# Patient Record
Sex: Female | Born: 1985 | Race: White | Hispanic: No | Marital: Single | Smoking: Current every day smoker
Health system: Southern US, Community
[De-identification: ages and names within clinical notes are randomized; demographics above are authoritative.]

## PROBLEM LIST (undated history)

## (undated) DIAGNOSIS — R87619 Unspecified abnormal cytological findings in specimens from cervix uteri: Secondary | ICD-10-CM

## (undated) DIAGNOSIS — N390 Urinary tract infection, site not specified: Secondary | ICD-10-CM

## (undated) DIAGNOSIS — R87629 Unspecified abnormal cytological findings in specimens from vagina: Secondary | ICD-10-CM

## (undated) DIAGNOSIS — D649 Anemia, unspecified: Secondary | ICD-10-CM

## (undated) DIAGNOSIS — N159 Renal tubulo-interstitial disease, unspecified: Secondary | ICD-10-CM

## (undated) DIAGNOSIS — F1910 Other psychoactive substance abuse, uncomplicated: Secondary | ICD-10-CM

## (undated) DIAGNOSIS — F329 Major depressive disorder, single episode, unspecified: Secondary | ICD-10-CM

## (undated) DIAGNOSIS — F32A Depression, unspecified: Secondary | ICD-10-CM

## (undated) DIAGNOSIS — F419 Anxiety disorder, unspecified: Secondary | ICD-10-CM

## (undated) DIAGNOSIS — IMO0002 Reserved for concepts with insufficient information to code with codable children: Secondary | ICD-10-CM

## (undated) DIAGNOSIS — A599 Trichomoniasis, unspecified: Secondary | ICD-10-CM

## (undated) HISTORY — PX: INDUCED ABORTION: SHX677

---
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
## 2006-02-09 ENCOUNTER — Emergency Department (HOSPITAL_COMMUNITY): Admission: EM | Admit: 2006-02-09 | Discharge: 2006-02-09 | Payer: Self-pay | Admitting: Emergency Medicine

## 2006-07-04 ENCOUNTER — Ambulatory Visit: Payer: Self-pay | Admitting: Gastroenterology

## 2006-07-11 ENCOUNTER — Ambulatory Visit: Payer: Self-pay | Admitting: Gastroenterology

## 2006-12-11 ENCOUNTER — Inpatient Hospital Stay (HOSPITAL_COMMUNITY): Admission: AD | Admit: 2006-12-11 | Discharge: 2006-12-11 | Payer: Self-pay | Admitting: Obstetrics

## 2007-05-02 ENCOUNTER — Inpatient Hospital Stay (HOSPITAL_COMMUNITY): Admission: AD | Admit: 2007-05-02 | Discharge: 2007-05-02 | Payer: Self-pay | Admitting: Obstetrics & Gynecology

## 2007-07-08 ENCOUNTER — Inpatient Hospital Stay (HOSPITAL_COMMUNITY): Admission: AD | Admit: 2007-07-08 | Discharge: 2007-07-08 | Payer: Self-pay | Admitting: Obstetrics and Gynecology

## 2007-09-18 ENCOUNTER — Inpatient Hospital Stay (HOSPITAL_COMMUNITY): Admission: AD | Admit: 2007-09-18 | Discharge: 2007-09-20 | Payer: Self-pay | Admitting: Obstetrics and Gynecology

## 2007-10-15 ENCOUNTER — Inpatient Hospital Stay (HOSPITAL_COMMUNITY): Admission: AD | Admit: 2007-10-15 | Discharge: 2007-10-18 | Payer: Self-pay | Admitting: Obstetrics & Gynecology

## 2007-10-25 ENCOUNTER — Inpatient Hospital Stay (HOSPITAL_COMMUNITY): Admission: AD | Admit: 2007-10-25 | Discharge: 2007-10-25 | Payer: Self-pay | Admitting: Obstetrics & Gynecology

## 2007-10-31 ENCOUNTER — Inpatient Hospital Stay (HOSPITAL_COMMUNITY): Admission: AD | Admit: 2007-10-31 | Discharge: 2007-11-02 | Payer: Self-pay | Admitting: Obstetrics & Gynecology

## 2009-02-07 ENCOUNTER — Emergency Department (HOSPITAL_COMMUNITY): Admission: EM | Admit: 2009-02-07 | Discharge: 2009-02-07 | Payer: Self-pay | Admitting: Emergency Medicine

## 2009-05-12 ENCOUNTER — Inpatient Hospital Stay (HOSPITAL_COMMUNITY): Admission: AD | Admit: 2009-05-12 | Discharge: 2009-05-13 | Payer: Self-pay | Admitting: Obstetrics & Gynecology

## 2009-09-24 ENCOUNTER — Inpatient Hospital Stay (HOSPITAL_COMMUNITY): Admission: AD | Admit: 2009-09-24 | Discharge: 2009-09-24 | Payer: Self-pay | Admitting: Obstetrics and Gynecology

## 2009-10-27 ENCOUNTER — Inpatient Hospital Stay (HOSPITAL_COMMUNITY): Admission: AD | Admit: 2009-10-27 | Discharge: 2009-10-27 | Payer: Self-pay | Admitting: Obstetrics & Gynecology

## 2009-11-02 ENCOUNTER — Emergency Department (HOSPITAL_COMMUNITY): Admission: EM | Admit: 2009-11-02 | Discharge: 2009-11-02 | Payer: Self-pay | Admitting: Emergency Medicine

## 2009-11-06 ENCOUNTER — Emergency Department (HOSPITAL_COMMUNITY): Admission: EM | Admit: 2009-11-06 | Discharge: 2009-11-06 | Payer: Self-pay | Admitting: Emergency Medicine

## 2010-01-01 ENCOUNTER — Inpatient Hospital Stay (HOSPITAL_COMMUNITY): Admission: AD | Admit: 2010-01-01 | Discharge: 2010-01-01 | Payer: Self-pay | Admitting: Obstetrics & Gynecology

## 2010-01-10 IMAGING — CR DG LUMBAR SPINE COMPLETE 4+V
5 series · 5 of 5 positions shown · non-contrast
Comparison: None

CLINICAL DATA: Low back pain.  Recent fall.

LUMBAR SPINE - COMPLETE 4+ VIEW

[t l-spine a.p.]
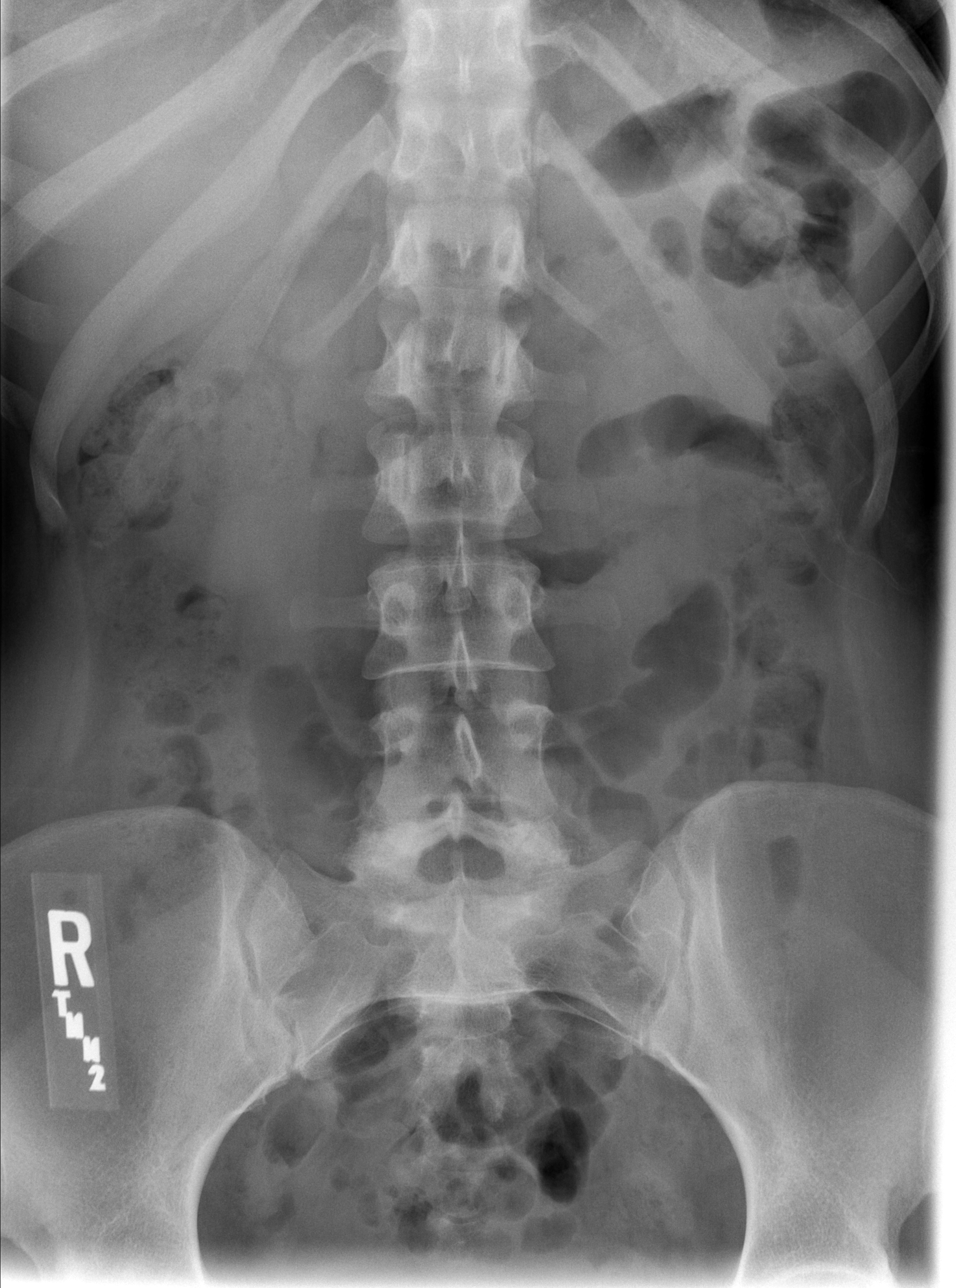

[t l-spine oblique exposure (1 of 2)]
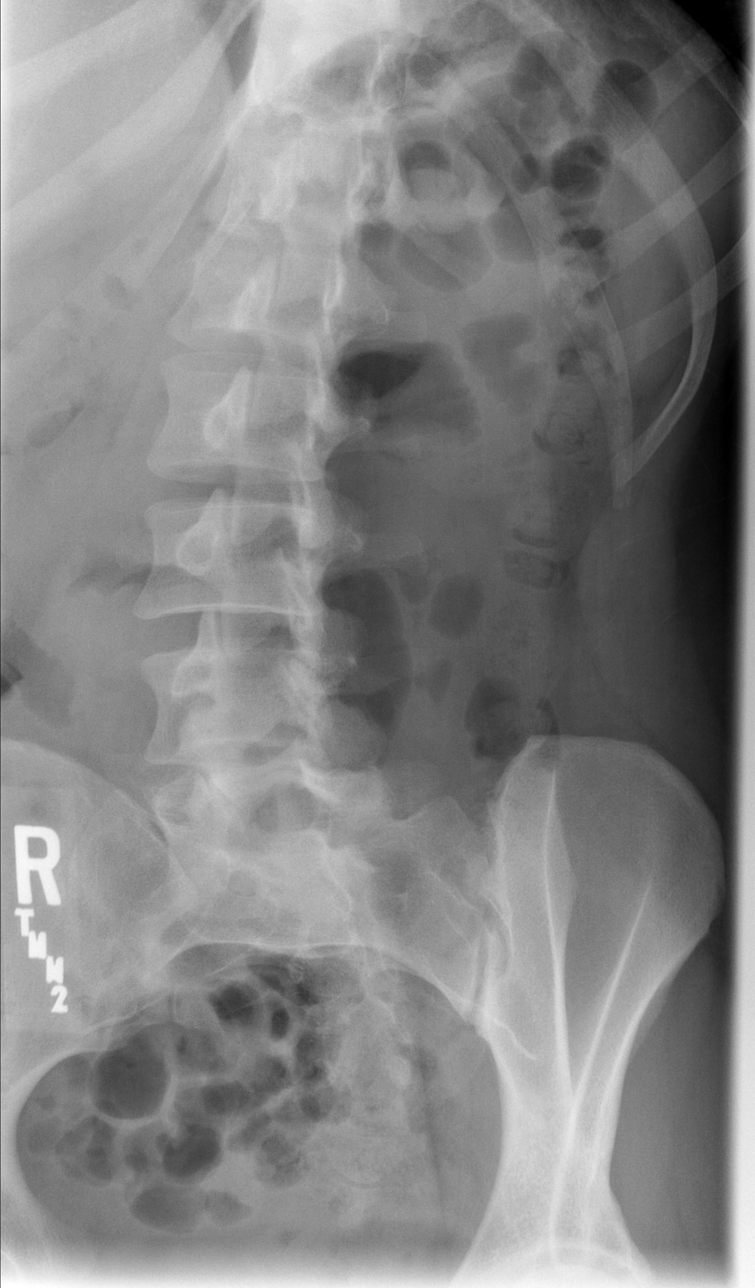

[t l-spine oblique exposure (2 of 2)]
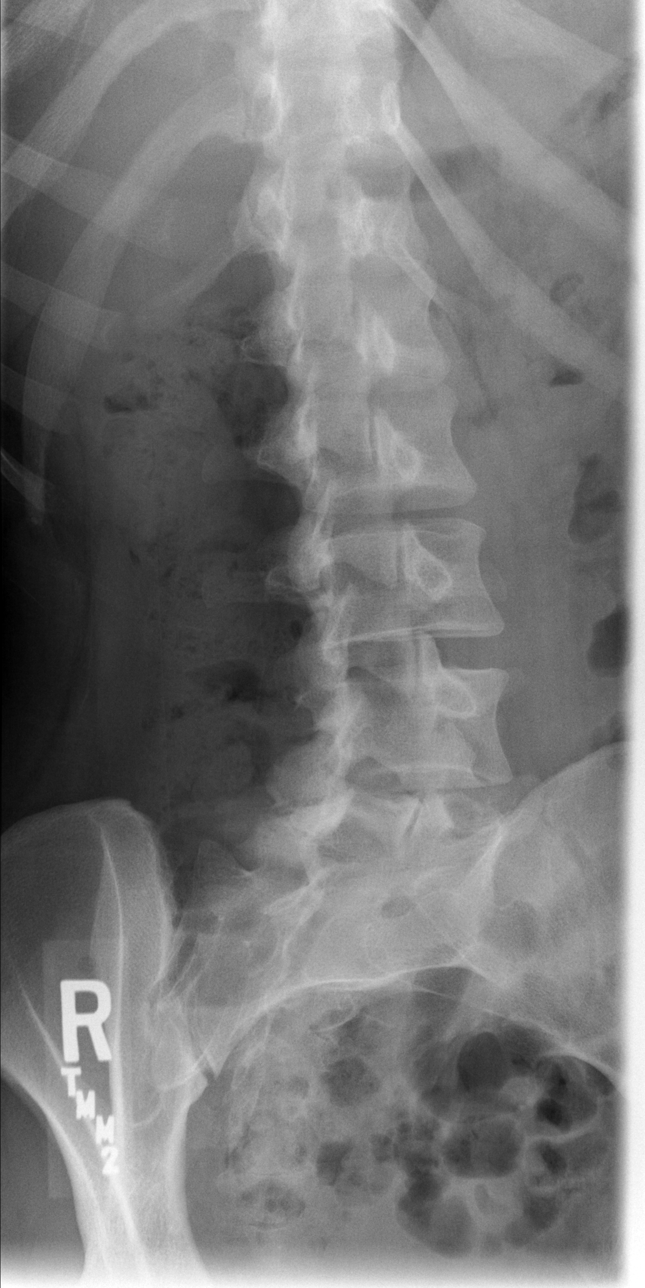

[t l-spine lat]
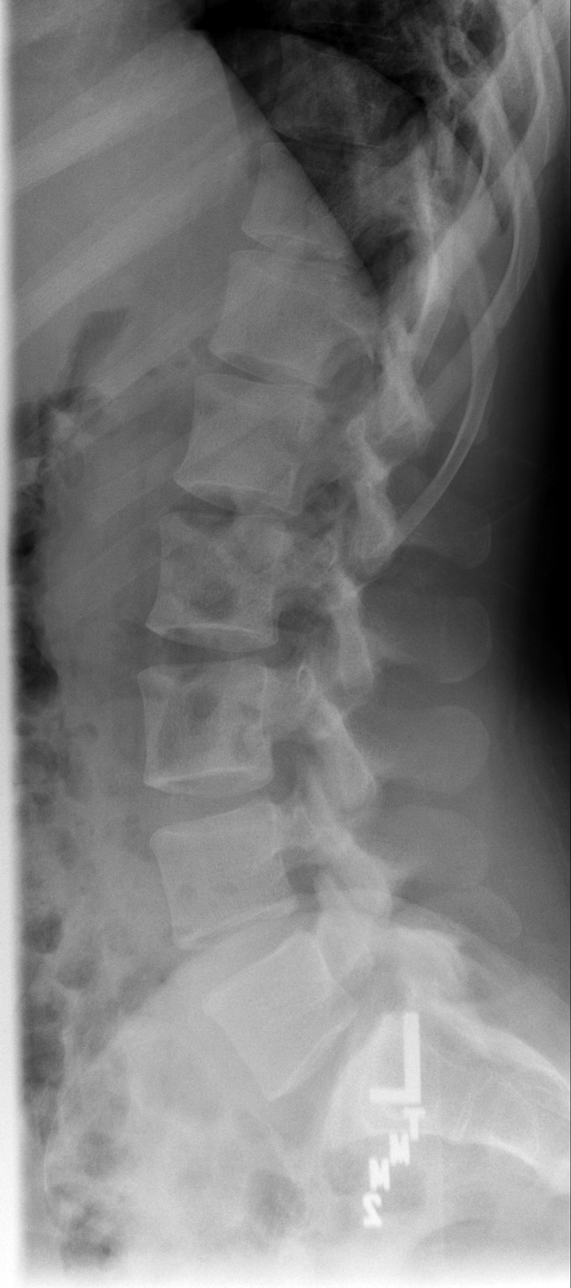

[t l-spine l5-s1 spot]
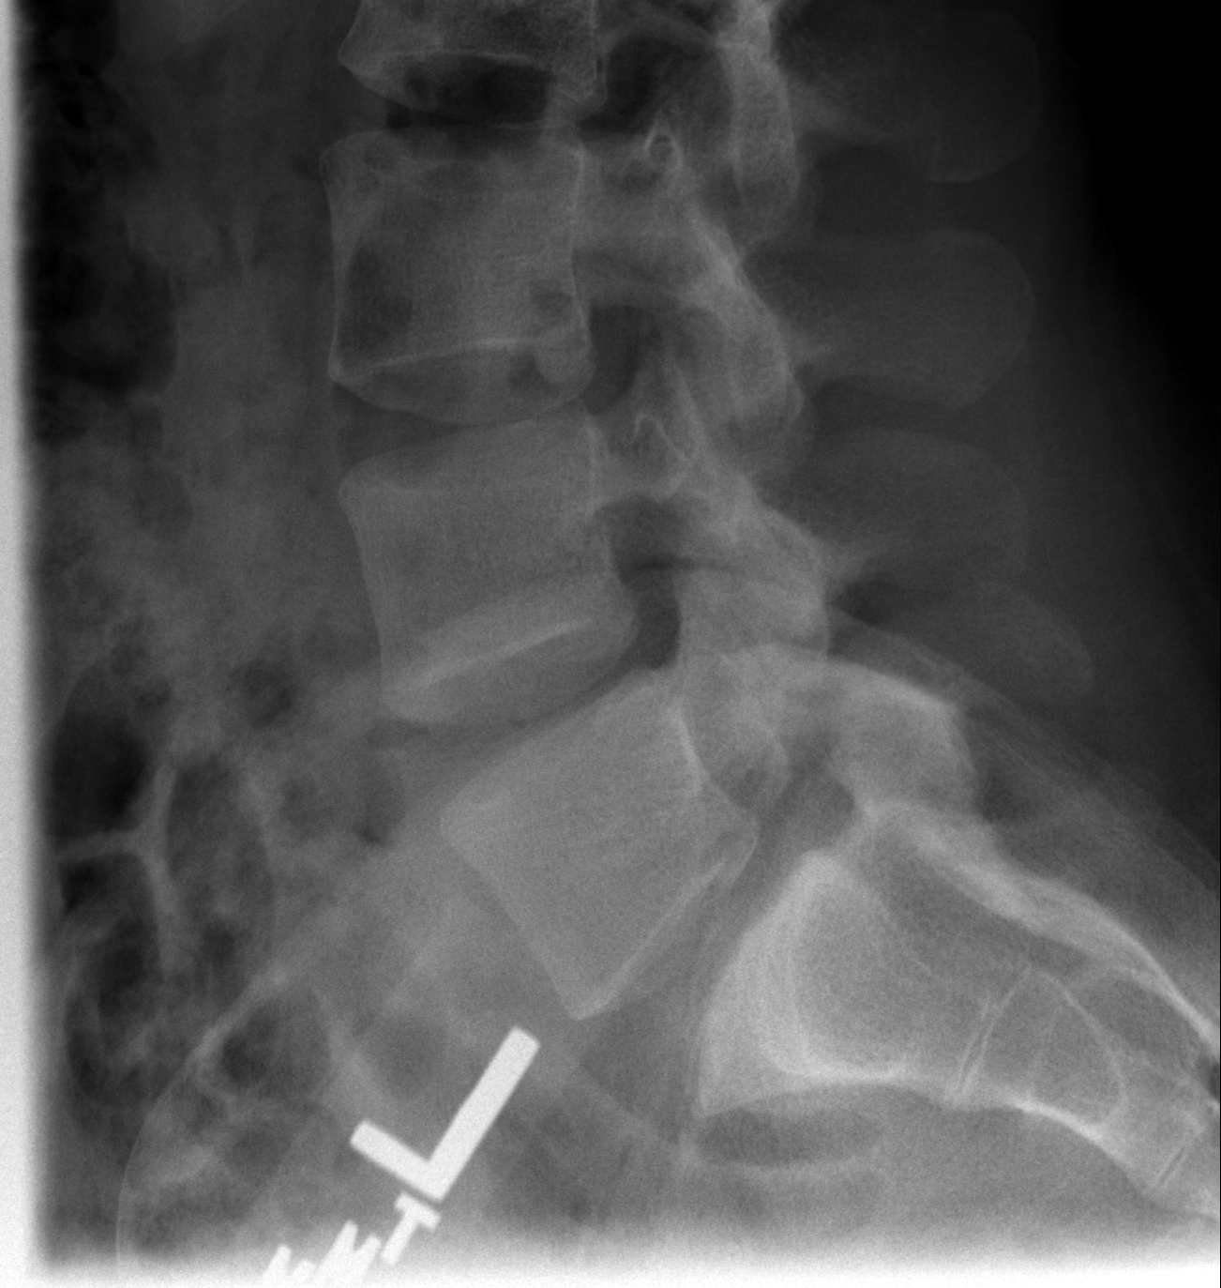

[5 of 5 positions shown; findings below may reference images not displayed]

FINDINGS: There is no evidence of lumbar spine fracture.  Alignment
is normal.  Intervertebral disc spaces are maintained.
IMPRESSION: Negative.

## 2010-11-06 ENCOUNTER — Emergency Department (HOSPITAL_COMMUNITY)
Admission: EM | Admit: 2010-11-06 | Discharge: 2010-11-06 | Payer: Self-pay | Source: Home / Self Care | Admitting: Emergency Medicine

## 2011-02-15 LAB — URINALYSIS, ROUTINE W REFLEX MICROSCOPIC
Glucose, UA: NEGATIVE mg/dL
Hgb urine dipstick: NEGATIVE
Ketones, ur: 15 mg/dL — AB
Nitrite: NEGATIVE
Protein, ur: NEGATIVE mg/dL
Specific Gravity, Urine: 1.03 (ref 1.005–1.030)
Urobilinogen, UA: 0.2 mg/dL (ref 0.0–1.0)
pH: 5.5 (ref 5.0–8.0)

## 2011-02-15 LAB — POCT I-STAT, CHEM 8
BUN: 14 mg/dL (ref 6–23)
Creatinine, Ser: 0.9 mg/dL (ref 0.4–1.2)
Glucose, Bld: 97 mg/dL (ref 70–99)
Sodium: 141 mEq/L (ref 135–145)
TCO2: 27 mmol/L (ref 0–100)

## 2011-02-15 LAB — WET PREP, GENITAL
Clue Cells Wet Prep HPF POC: NONE SEEN
Trich, Wet Prep: NONE SEEN
Yeast Wet Prep HPF POC: NONE SEEN

## 2011-02-15 LAB — URINE MICROSCOPIC-ADD ON

## 2011-02-15 LAB — GC/CHLAMYDIA PROBE AMP, GENITAL: GC Probe Amp, Genital: NEGATIVE

## 2011-02-21 LAB — URINALYSIS, ROUTINE W REFLEX MICROSCOPIC
Glucose, UA: NEGATIVE mg/dL
Ketones, ur: NEGATIVE mg/dL
Leukocytes, UA: NEGATIVE
pH: 7 (ref 5.0–8.0)

## 2011-02-21 LAB — WET PREP, GENITAL
Trich, Wet Prep: NONE SEEN
Yeast Wet Prep HPF POC: NONE SEEN

## 2011-02-21 LAB — URINE MICROSCOPIC-ADD ON

## 2011-03-09 LAB — COMPREHENSIVE METABOLIC PANEL
BUN: 7 mg/dL (ref 6–23)
CO2: 28 mEq/L (ref 19–32)
Calcium: 9.3 mg/dL (ref 8.4–10.5)
Creatinine, Ser: 0.71 mg/dL (ref 0.4–1.2)
GFR calc non Af Amer: >60 mL/min (ref >60)
Glucose, Bld: 102 mg/dL — ABNORMAL HIGH (ref 70–99)

## 2011-03-09 LAB — DIFFERENTIAL
Eosinophils Absolute: 0.1 10*3/uL (ref 0.0–0.7)
Lymphocytes Relative: 21 % (ref 12–46)
Lymphs Abs: 2.3 10*3/uL (ref 0.7–4.0)
Neutro Abs: 7.6 10*3/uL (ref 1.7–7.7)
Neutrophils Relative %: 71 % (ref 43–77)

## 2011-03-09 LAB — CBC
Hemoglobin: 12.3 g/dL (ref 12.0–15.0)
MCHC: 34.7 g/dL (ref 30.0–36.0)
MCV: 97.2 fL (ref 78.0–100.0)
RBC: 3.65 MIL/uL — ABNORMAL LOW (ref 3.87–5.11)

## 2011-03-10 LAB — URINALYSIS, ROUTINE W REFLEX MICROSCOPIC
Glucose, UA: NEGATIVE mg/dL
Protein, ur: NEGATIVE mg/dL

## 2011-03-10 LAB — POCT PREGNANCY, URINE: Preg Test, Ur: NEGATIVE

## 2011-03-11 LAB — CBC
MCHC: 34.2 g/dL (ref 30.0–36.0)
RBC: 4.02 MIL/uL (ref 3.87–5.11)
RDW: 12.5 % (ref 11.5–15.5)

## 2011-03-11 LAB — URINALYSIS, ROUTINE W REFLEX MICROSCOPIC
Glucose, UA: NEGATIVE mg/dL
Nitrite: NEGATIVE
Protein, ur: NEGATIVE mg/dL
Urobilinogen, UA: 0.2 mg/dL (ref 0.0–1.0)

## 2011-03-11 LAB — POCT PREGNANCY, URINE: Preg Test, Ur: NEGATIVE

## 2011-03-15 LAB — URINALYSIS, ROUTINE W REFLEX MICROSCOPIC
Leukocytes, UA: NEGATIVE
Nitrite: NEGATIVE
Specific Gravity, Urine: 1.025 (ref 1.005–1.030)
pH: 5.5 (ref 5.0–8.0)

## 2011-03-15 LAB — WET PREP, GENITAL
Trich, Wet Prep: NONE SEEN
Yeast Wet Prep HPF POC: NONE SEEN

## 2011-03-15 LAB — URINE MICROSCOPIC-ADD ON

## 2011-03-15 LAB — POCT PREGNANCY, URINE: Preg Test, Ur: NEGATIVE

## 2011-03-18 LAB — URINE CULTURE: Colony Count: >=100000

## 2011-03-18 LAB — URINE MICROSCOPIC-ADD ON

## 2011-03-18 LAB — PREGNANCY, URINE: Preg Test, Ur: NEGATIVE

## 2011-03-18 LAB — URINALYSIS, ROUTINE W REFLEX MICROSCOPIC
Bilirubin Urine: NEGATIVE
Ketones, ur: NEGATIVE mg/dL
Nitrite: POSITIVE — AB
Protein, ur: NEGATIVE mg/dL
Urobilinogen, UA: 0.2 mg/dL (ref 0.0–1.0)

## 2011-04-06 ENCOUNTER — Inpatient Hospital Stay (HOSPITAL_COMMUNITY): Payer: Self-pay

## 2011-04-06 ENCOUNTER — Encounter (HOSPITAL_COMMUNITY): Payer: Self-pay | Admitting: Radiology

## 2011-04-06 ENCOUNTER — Inpatient Hospital Stay (HOSPITAL_COMMUNITY)
Admission: AD | Admit: 2011-04-06 | Discharge: 2011-04-06 | Disposition: A | Discharge status: Home / Self Care | Payer: Self-pay | Source: Ambulatory Visit | Attending: Obstetrics & Gynecology | Admitting: Obstetrics & Gynecology

## 2011-04-06 DIAGNOSIS — O2390 Unspecified genitourinary tract infection in pregnancy, unspecified trimester: Secondary | ICD-10-CM | POA: Insufficient documentation

## 2011-04-06 DIAGNOSIS — A499 Bacterial infection, unspecified: Secondary | ICD-10-CM | POA: Insufficient documentation

## 2011-04-06 DIAGNOSIS — N760 Acute vaginitis: Secondary | ICD-10-CM | POA: Insufficient documentation

## 2011-04-06 DIAGNOSIS — R109 Unspecified abdominal pain: Secondary | ICD-10-CM | POA: Insufficient documentation

## 2011-04-06 DIAGNOSIS — B9689 Other specified bacterial agents as the cause of diseases classified elsewhere: Secondary | ICD-10-CM | POA: Insufficient documentation

## 2011-04-06 LAB — URINALYSIS, ROUTINE W REFLEX MICROSCOPIC
Bilirubin Urine: NEGATIVE
Glucose, UA: NEGATIVE mg/dL
Hgb urine dipstick: NEGATIVE
Protein, ur: NEGATIVE mg/dL

## 2011-04-06 LAB — CBC
HCT: 35.3 % — ABNORMAL LOW (ref 36.0–46.0)
MCH: 31.4 pg (ref 26.0–34.0)
MCV: 95.7 fL (ref 78.0–100.0)
RDW: 12.4 % (ref 11.5–15.5)
WBC: 9.7 10*3/uL (ref 4.0–10.5)

## 2011-04-06 LAB — WET PREP, GENITAL

## 2011-04-07 LAB — GC/CHLAMYDIA PROBE AMP, GENITAL
Chlamydia, DNA Probe: NEGATIVE
GC Probe Amp, Genital: NEGATIVE

## 2011-04-20 NOTE — Discharge Summary (Signed)
NAMEKYLIEANN, [name] [name]               ACCOUNT NO.:  [id number]   MEDICAL RECORD NO.:  [id number]          PATIENT TYPE:  INP   LOCATION:  9318                          FACILITY:  [facility]   PHYSICIAN:  Ilda Mori, M.D.   DATE OF BIRTH:  1985-12-24   DATE OF ADMISSION:  09/18/2007  DATE OF DISCHARGE:  09/20/2007                               DISCHARGE SUMMARY   FINAL DIAGNOSIS:  Right pyelonephritis.   SECONDARY DIAGNOSIS:  A 28-week intrauterine pregnancy.   PROCEDURE:  IV antibiotics.   CONDITION ON DISCHARGE:  Improved.   This is a 25 year old gravida 2, para 1 who was admitted through MAU  with 3 days of fever and chills, which was diagnosed in MAU as being  consistent with right pyelonephritis.  She was treated with Rocephin 1  gram IV q.24h. with good response.  She became afebrile and remained so  for 24 hours.  On the morning of the third hospital day, she was felt  ready to be discharged.  She was discharged on a regular diet, told her  to limit her activity.  She was given Keflex 500 mg to take four times a  day for 5 days and asked to make a follow-up appointment in the office  in 1 week.   LABORATORY DATA:  Since she was 28 weeks, she had a 1-hour glucose test  done which was 105 mg/dl.  Her hemoglobin on admission was 10.3 with a  19,000 white count.  Her urine grew out 100,000 E-coli.  Sensitivities  are pending at the time of this dictation.      Ilda Mori, M.D.  Electronically Signed     RK/MEDQ  D:  09/20/2007  T:  09/21/2007  Job:  [id number]

## 2011-04-20 NOTE — Discharge Summary (Signed)
[name], [name]               ACCOUNT NO.:  [id number]   MEDICAL RECORD NO.:  [id number]          PATIENT TYPE:  INP   LOCATION:  9153                          [facility]ACILITY:  [facility]   PHYSICIAN:  Ilda Mori, M.D.   DATE OF BIRTH:  1986-03-05   DATE OF ADMISSION:  10/15/2007  DATE OF DISCHARGE:  10/18/2007                               DISCHARGE SUMMARY   FINAL DIAGNOSIS:  Pyelonephritis.   SECONDARY DIAGNOSIS:  Intrauterine pregnancy 32 weeks' gestation.   PROCEDURES:  Tocolytics and IV antibiotics.   CONDITION ON DISCHARGE:  Was stable.   This is a 25 year old, gravida 2, para 0-1-0-1 with her first delivery  at 59 weeks who presented at 80 weeks' gestation with fever, chills and  back pain.  The patient had a similar episode one month ago for which  she was hospitalized at [facility] and found to have  pyelonephritis with greater than 100,000 E-coli sensitive to all  antibiotics tested.  Evaluation on admission revealed some mild right  CVA tenderness.  There were no other signs of localized infection.  The  rest of her exam was normal.  Her urinalysis showed negative nitrites  and only small leukocytes.  White blood count on admission was 22.5  thousand with a left shift.  Renal profile was normal with a creatinine  of 0.49.  Her cervix on admission was 3- to 4-cm dilated, 80% effaced  with a vertex at -1 station.  Her admitting diagnosis was probable  recurrent right pyelonephritis and premature cervical dilatation.  The  patient was started on ampicillin and admitted.  On the morning of the  first hospital day, she was given IV steroids because of her advanced  cervical dilatation and started on oral tocolytic nifedipine 10 mg q.6h.   The hospital course was benign with resolution of her fevers.  Her  antibiotic was changed from ampicillin to Rocephin on the second  hospital day.  The cultures from her admission were only 9000 colonies  were counted, and her blood  cultures were negative, and therefore  pyelonephritis could not be confirmed, although no other source for her  fever and chills was identified.  On the morning of the fourth  postoperative hospital day, the patient had been afebrile for 48 hours.  She had received IV antibiotics for 48 hours.  She had received  dexamethasone protocol for fetal lung maturity and had received oral  tocolytics.  She was perfectly stable at that time and voiced no  complaints.  In addition, a fetal fibronectin had been done on the  morning of discharge, and this came back negative.  She was therefore  felt ready to be discharged.  She was discharged on a regular diet.  She  was asked to limit her activity and to rest at home.  She was given  Procardia 10 mg to take 3 times a day, Macrobid to take 1 tablet daily  for prophylaxis, and she was asked to return to the office in 5 days.      Ilda Mori, M.D.  Electronically Signed  RK/MEDQ  D:  10/18/2007  T:  10/19/2007  Job:  [id number]

## 2011-04-23 NOTE — Procedures (Signed)
[facility]                            ABDOMINAL ULTRASOUND REPORT   NAME:Kersh, CHIANN GOFFREDO                      MRN:          [id number]  DATE:07/12/2006                            DOB:          1986-06-13    ACCESSION # [id number]   READING PHYSICIAN:  Vania Rea. Jarold Motto, MD, [facility], FACP   PROCEDURE:  Multiplanar abdominal ultrasound imaging was performed in the  upright, supine, right and left lateral decubitus positions.   RESULTS:  Abdominal aorta normal.  Maximal diameter 1.2 cm.  The IVC is  patent.   The pancreas appears normal throughout the head, body and tail without  evidence of ductal dilatation, pancreatic masses, or peripancreatic  inflammation.   Gallbladder is well distended, thin walled, with no pericholecystic fluid or  intraluminal echogenic foci to suggest gallstone disease.  Wall thickness  2.9 mm.   The common bile duct measures 2.7 mm in maximal diameter without evidence of  intraluminal foci.   The liver appears normal without evidence of parenchymal lesion, ductal  dilatation or vascular abnormality.   Kidneys are normal in appearance, right 9.8 cm, left 9.5 cm.   Spleen is normal in size, measuring 10.9 cm without parenchymal lesion.   ASSESSMENT:  This is a normal upper abdominal ultrasound exam without  evidence of cholelithiasis or biliary ductal dilatation.  The pancreas and  liver are well visualized and appear normal.                                   Vania Rea. Jarold Motto, MD, [facility], [place]   DRP/MedQ  DD:  07/12/2006  DT:  07/12/2006  Job #:  [id number]   cc:   Barbette Hair. Arlyce Dice, MD, [facility]

## 2011-04-23 NOTE — Discharge Summary (Signed)
[name] [name], [name] [name]               ACCOUNT NO.:  [id number]   MEDICAL RECORD NO.:  [id number]          PATIENT TYPE:  INP   LOCATION:  9320                          [facility]ACILITY:  [facility]   PHYSICIAN:  Roseanna Rainbow, M.D.DATE OF BIRTH:  Jul 30, 1986   DATE OF ADMISSION:  10/31/2005  DATE OF DISCHARGE:  11/07/2005                                 DISCHARGE SUMMARY   CHIEF COMPLAINT:  The patient is an 25 year old, gravida 1, Caucasian female  with estimated date of confinement of December 17, 2005 who complains of  leaking fluid.   HISTORY OF PRESENT ILLNESS:  The patient reports leakage of fluid for  several hours prior to presentation.  She described it has gush of clear  fluid. She denied any uterine contraction.  She also denied any fever or  chills or dysuria.   PAST SURGICAL HISTORY:  She denies.   PAST MEDICAL HISTORY:  She denies.   MEDICATIONS:  Prenatal vitamins.   ALLERGIES:  No known drug allergies.   SOCIAL HISTORY:  She reports tobacco use.  She denies any alcohol or  recreational drug use.   PHYSICAL EXAMINATION:  VITAL SIGNS:  Stable. Afebrile.  ABDOMEN:  Gravid, nontender.  PELVIC:  Deferred.  Vaginal pool.  Nitrazine and fern positive.   LABORATORY DATA:  Ultrasound showed cephalic presentation.  Amniotic fluid  index 10.4.  The cervix non-measurable length, 1.2 cm dilated.  Fetal heart  tracing reassuring.  Tocodynamometer:  No uterine contractions.   ASSESSMENT:  Intrauterine pregnancy at 33+ weeks with preterm premature  rupture of membranes.   PLAN:  Admission.  Expectant management.  Broad spectrum parenteral  antibiotics.   HOSPITAL COURSE:  The patient was admitted and received a course of steroids  and was started on parenteral clindamycin.  On ultrasound on November 28,  the estimated fetal weight gross percentile was between the 5th and the 10th  percentile with an amniotic fluid index of 12.4.  A sample of the vaginal  pool was sent to the lab  for fetal lung maturity study.  The vaginal pool  was positive for PG. At this point the plan was made for induction of labor.  The patient had spontaneous labor and was delivered of live female on  December 1.  Her postpartum course was uneventful and she was discharged to  home on postpartum day #2.   DISCHARGE DIAGNOSES:  1.  Intrauterine pregnancy at 33+ weeks with preterm premature rupture of      membranes.  2.  Preterm labor.  3.  Preterm delivery.   OPERATION/PROCEDURE:  Vaginal delivery.   CONDITION ON DISCHARGE:  Good.   DIET:  Regular.   MEDICATIONS:  Ibuprofen, prenatal vitamins.   DISPOSITION:  The patient was to follow up in the office in six weeks.      Roseanna Rainbow, M.D.  Electronically Signed     LAJ/MEDQ  D:  11/26/2005  T:  11/28/2005  Job:  [id number]

## 2011-04-23 NOTE — Assessment & Plan Note (Signed)
[facility]                           GASTROENTEROLOGY OFFICE NOTE   NAME:HOYLEBernette, Seeman                      MRN:          [id number]  DATE:07/04/2006                            DOB:          06/23/1986    REFERRING PHYSICIAN:  Self-referred.   PROBLEM:  Abdominal pain.   HISTORY:  Molli is a pleasant, generally healthy 25 year old white female  who had her first child approximately 8 months ago.  She admits to having a  lot of stress since the birth of her child.  She says that she has been  having some abdominal pain over the past several months which at times is  sharp and located in the epigastrium and right upper quadrant.  She says at  other times, the pain seems to migrate to different places in her abdomen.  She has had some associated nausea but no vomiting, had one particularly bad  episode a couple of months ago that lasted for about 20 to 30 minutes and  then resolved.  She has not had any associated fever or chills and though  her appetite has been some decreased, her weight has been stable.  She says  she had been having some occasional constipation after childbirth and some  problems with external hemorrhoids but over the past couple of months, her  bowels have been more regular.  She denies any heartburn or indigestion and  says that greasy foods and fatty foods do seem to aggravate her pain.   She denies any aspirin or NSAID use, is not on any regular medications,  denies any ETOH or drug use.   CURRENT MEDICATIONS:  None.   ALLERGIES:  NO KNOWN DRUG ALLERGIES.   PAST MEDICAL HISTORY:  Benign.   FAMILY HISTORY:  Pertinent for gallbladder disease in her mother and her  sister and diabetes in a great-grandfather and colon cancer in her great-  grandfather.   SOCIAL HISTORY:  The patient is single.  Lives with her boyfriend and some  other friends along with her child.  She is not currently working.  She has  a 10th  grade education.  She is a smoker.  Again, denies alcohol or drug  use.   REVIEW OF SYSTEMS:  Pertinent for some low back pain and depression.  She is  currently having a menstrual period.   PHYSICAL EXAMINATION:  GENERAL APPEARANCE:  A well-developed white female in  no acute distress.  VITAL SIGNS:  Height is 5 feet 6 inches, weight is 172, blood pressure  112/74, pulse 88.  HEENT:  Atraumatic and normocephalic.  EOMI, PERRLA.  Sclerae are anicteric.  LUNGS:  Clear to auscultation and percussion.  CARDIOVASCULAR:  Regular rate and rhythm with S1 and S2.  ABDOMEN:  Soft.  Bowel sounds are active.  She is nontender.  There is no  mass or hepatosplenomegaly.  RECTAL:  Exam is not done at this time.   IMPRESSION:  A 25 year old white female with intermittent epigastric and  then generalized abdominal discomfort associated with nausea, rule out  gallbladder disease, rule out irritable bowel syndrome, rule out  inflammatory bowel disease though doubt.   PLAN:  1.  Check CBC with differential, CMET and sed rate today.  2.  Schedule upper abdominal ultrasound.  3.  Trial of Robinul Forte 2 mg p.o. b.i.d. and Protonix 40 mg q.a.m.  She      was given samples and a prescription.  4.  Return office visit in three to four weeks or sooner p.r.n.                                   Amy Esterwood, PA-C                                Robert D. Arlyce Dice, MD, [facility]   AE/MedQ  DD:  07/04/2006  DT:  07/05/2006  Job #:  [id number]

## 2011-06-07 ENCOUNTER — Emergency Department (HOSPITAL_COMMUNITY)
Admission: EM | Admit: 2011-06-07 | Discharge: 2011-06-07 | Disposition: A | Discharge status: Home / Self Care | Payer: Self-pay | Attending: Emergency Medicine | Admitting: Emergency Medicine

## 2011-06-07 DIAGNOSIS — R059 Cough, unspecified: Secondary | ICD-10-CM | POA: Insufficient documentation

## 2011-06-07 DIAGNOSIS — R0989 Other specified symptoms and signs involving the circulatory and respiratory systems: Secondary | ICD-10-CM | POA: Insufficient documentation

## 2011-06-07 DIAGNOSIS — R05 Cough: Secondary | ICD-10-CM | POA: Insufficient documentation

## 2011-06-07 DIAGNOSIS — R0609 Other forms of dyspnea: Secondary | ICD-10-CM | POA: Insufficient documentation

## 2011-06-07 DIAGNOSIS — J069 Acute upper respiratory infection, unspecified: Secondary | ICD-10-CM | POA: Insufficient documentation

## 2011-06-07 DIAGNOSIS — R509 Fever, unspecified: Secondary | ICD-10-CM | POA: Insufficient documentation

## 2011-06-07 DIAGNOSIS — J3489 Other specified disorders of nose and nasal sinuses: Secondary | ICD-10-CM | POA: Insufficient documentation

## 2011-06-07 DIAGNOSIS — J40 Bronchitis, not specified as acute or chronic: Secondary | ICD-10-CM | POA: Insufficient documentation

## 2011-07-06 ENCOUNTER — Emergency Department (HOSPITAL_COMMUNITY)
Admission: EM | Admit: 2011-07-06 | Discharge: 2011-07-06 | Disposition: A | Discharge status: Home / Self Care | Payer: Self-pay | Attending: Emergency Medicine | Admitting: Emergency Medicine

## 2011-07-06 ENCOUNTER — Encounter (HOSPITAL_COMMUNITY): Payer: Self-pay | Admitting: *Deleted

## 2011-07-06 ENCOUNTER — Emergency Department (HOSPITAL_COMMUNITY): Payer: Self-pay

## 2011-07-06 DIAGNOSIS — IMO0001 Reserved for inherently not codable concepts without codable children: Secondary | ICD-10-CM | POA: Insufficient documentation

## 2011-07-06 DIAGNOSIS — R630 Anorexia: Secondary | ICD-10-CM | POA: Insufficient documentation

## 2011-07-06 DIAGNOSIS — R509 Fever, unspecified: Secondary | ICD-10-CM | POA: Insufficient documentation

## 2011-07-06 DIAGNOSIS — R109 Unspecified abdominal pain: Secondary | ICD-10-CM | POA: Insufficient documentation

## 2011-07-06 DIAGNOSIS — R0602 Shortness of breath: Secondary | ICD-10-CM | POA: Insufficient documentation

## 2011-07-06 DIAGNOSIS — B9789 Other viral agents as the cause of diseases classified elsewhere: Secondary | ICD-10-CM | POA: Insufficient documentation

## 2011-07-06 LAB — URINALYSIS, ROUTINE W REFLEX MICROSCOPIC
Bilirubin Urine: NEGATIVE
Hgb urine dipstick: NEGATIVE
Protein, ur: NEGATIVE mg/dL
Urobilinogen, UA: 0.2 mg/dL (ref 0.0–1.0)

## 2011-07-06 LAB — URINE MICROSCOPIC-ADD ON

## 2011-09-14 LAB — CBC
HCT: 30.1 — ABNORMAL LOW
HCT: 24.5 — ABNORMAL LOW
HCT: 27.1 — ABNORMAL LOW
Hemoglobin: 9.4 — ABNORMAL LOW
Hemoglobin: 9.7 — ABNORMAL LOW
MCHC: 34.8
MCHC: 35.5
MCHC: 35.1
MCHC: 35.5
MCV: 94.3
MCV: 94.5
Platelets: 284
Platelets: 307
Platelets: 174
RBC: 3.06 — ABNORMAL LOW
RBC: 2.6 — ABNORMAL LOW
RBC: 2.84 — ABNORMAL LOW
RBC: 2.9 — ABNORMAL LOW
RDW: 13.6
RDW: 13.7
RDW: 13.3
WBC: 20.1 — ABNORMAL HIGH
WBC: 23 — ABNORMAL HIGH

## 2011-09-14 LAB — URINE MICROSCOPIC-ADD ON

## 2011-09-14 LAB — DIFFERENTIAL
Basophils Absolute: 0
Basophils Absolute: 0
Eosinophils Relative: 0
Lymphocytes Relative: 5 — ABNORMAL LOW
Lymphocytes Relative: 5 — ABNORMAL LOW
Lymphs Abs: 1.1
Lymphs Abs: 1.2
Monocytes Absolute: 1.8 — ABNORMAL HIGH
Monocytes Relative: 8
Neutro Abs: 20.9 — ABNORMAL HIGH

## 2011-09-14 LAB — URINALYSIS, ROUTINE W REFLEX MICROSCOPIC
Glucose, UA: NEGATIVE
Glucose, UA: NEGATIVE
Hgb urine dipstick: NEGATIVE
Leukocytes, UA: NEGATIVE
Nitrite: NEGATIVE
Nitrite: NEGATIVE
Protein, ur: NEGATIVE
Specific Gravity, Urine: 1.02
Specific Gravity, Urine: 1.025
Urobilinogen, UA: 0.2
Urobilinogen, UA: 0.2
pH: 6

## 2011-09-14 LAB — CULTURE, BLOOD (ROUTINE X 2)
Culture: NO GROWTH
Culture: NO GROWTH

## 2011-09-14 LAB — COMPREHENSIVE METABOLIC PANEL
AST: 15
BUN: 2 — ABNORMAL LOW
CO2: 22
Calcium: 8.4
Chloride: 107
Creatinine, Ser: 0.48
GFR calc Af Amer: >60
GFR calc non Af Amer: >60
Total Bilirubin: 0.3

## 2011-09-14 LAB — STREP B DNA PROBE

## 2011-09-14 LAB — URINE CULTURE: Culture: NO GROWTH

## 2011-09-14 LAB — RENAL FUNCTION PANEL
BUN: 4 — ABNORMAL LOW
CO2: 24
Chloride: 103
Creatinine, Ser: 0.49
Glucose, Bld: 116 — ABNORMAL HIGH
Potassium: 4.2

## 2011-09-14 LAB — FETAL FIBRONECTIN
Fetal Fibronectin: POSITIVE
Fetal Fibronectin: NEGATIVE

## 2011-09-16 LAB — DIFFERENTIAL
Basophils Relative: 0
Eosinophils Absolute: 0
Monocytes Absolute: 2.4 — ABNORMAL HIGH
Neutro Abs: 15.7 — ABNORMAL HIGH
Neutrophils Relative %: 80 — ABNORMAL HIGH

## 2011-09-16 LAB — CBC
Hemoglobin: 10.3 — ABNORMAL LOW
RBC: 3.11 — ABNORMAL LOW
RDW: 12.7
WBC: 19.7 — ABNORMAL HIGH

## 2011-09-16 LAB — URINALYSIS, ROUTINE W REFLEX MICROSCOPIC
Nitrite: POSITIVE — AB
Specific Gravity, Urine: 1.02
Urobilinogen, UA: 2 — ABNORMAL HIGH
pH: 7

## 2011-09-16 LAB — URINE MICROSCOPIC-ADD ON

## 2011-09-16 LAB — COMPREHENSIVE METABOLIC PANEL
ALT: 8
Alkaline Phosphatase: 89
Chloride: 103
Glucose, Bld: 83
Potassium: 3.7
Sodium: 132 — ABNORMAL LOW
Total Bilirubin: 0.7
Total Protein: 6

## 2011-09-16 LAB — URINE CULTURE

## 2011-09-20 LAB — CBC
MCHC: 34.9
RDW: 13

## 2011-09-20 LAB — URINALYSIS, ROUTINE W REFLEX MICROSCOPIC
Bilirubin Urine: NEGATIVE
Glucose, UA: NEGATIVE
Ketones, ur: NEGATIVE
pH: 6

## 2011-09-20 LAB — WET PREP, GENITAL
Clue Cells Wet Prep HPF POC: NONE SEEN
Trich, Wet Prep: NONE SEEN
Yeast Wet Prep HPF POC: NONE SEEN

## 2011-09-20 LAB — GC/CHLAMYDIA PROBE AMP, GENITAL
Chlamydia, DNA Probe: NEGATIVE
GC Probe Amp, Genital: NEGATIVE

## 2011-11-16 ENCOUNTER — Encounter (HOSPITAL_COMMUNITY): Payer: Self-pay | Admitting: *Deleted

## 2011-11-16 ENCOUNTER — Inpatient Hospital Stay (HOSPITAL_COMMUNITY)
Admission: AD | Admit: 2011-11-16 | Discharge: 2011-11-16 | Disposition: A | Discharge status: Home / Self Care | Payer: Medicaid Other | Source: Ambulatory Visit | Attending: Obstetrics & Gynecology | Admitting: Obstetrics & Gynecology

## 2011-11-16 ENCOUNTER — Inpatient Hospital Stay (HOSPITAL_COMMUNITY): Payer: Medicaid Other

## 2011-11-16 DIAGNOSIS — M545 Low back pain, unspecified: Secondary | ICD-10-CM | POA: Insufficient documentation

## 2011-11-16 DIAGNOSIS — B373 Candidiasis of vulva and vagina: Secondary | ICD-10-CM | POA: Insufficient documentation

## 2011-11-16 DIAGNOSIS — R109 Unspecified abdominal pain: Secondary | ICD-10-CM | POA: Insufficient documentation

## 2011-11-16 DIAGNOSIS — Z3490 Encounter for supervision of normal pregnancy, unspecified, unspecified trimester: Secondary | ICD-10-CM

## 2011-11-16 DIAGNOSIS — Z1389 Encounter for screening for other disorder: Secondary | ICD-10-CM

## 2011-11-16 DIAGNOSIS — O2390 Unspecified genitourinary tract infection in pregnancy, unspecified trimester: Secondary | ICD-10-CM | POA: Insufficient documentation

## 2011-11-16 DIAGNOSIS — B3731 Acute candidiasis of vulva and vagina: Secondary | ICD-10-CM | POA: Insufficient documentation

## 2011-11-16 HISTORY — DX: Major depressive disorder, single episode, unspecified: F32.9

## 2011-11-16 HISTORY — DX: Anemia, unspecified: D64.9

## 2011-11-16 HISTORY — DX: Depression, unspecified: F32.A

## 2011-11-16 HISTORY — DX: Renal tubulo-interstitial disease, unspecified: N15.9

## 2011-11-16 HISTORY — DX: Unspecified abnormal cytological findings in specimens from cervix uteri: R87.619

## 2011-11-16 HISTORY — DX: Trichomoniasis, unspecified: A59.9

## 2011-11-16 HISTORY — DX: Urinary tract infection, site not specified: N39.0

## 2011-11-16 HISTORY — DX: Reserved for concepts with insufficient information to code with codable children: IMO0002

## 2011-11-16 HISTORY — DX: Anxiety disorder, unspecified: F41.9

## 2011-11-16 LAB — URINALYSIS, ROUTINE W REFLEX MICROSCOPIC
Bilirubin Urine: NEGATIVE
Ketones, ur: NEGATIVE mg/dL
Nitrite: NEGATIVE
pH: 6 (ref 5.0–8.0)

## 2011-11-16 LAB — WET PREP, GENITAL: Trich, Wet Prep: NONE SEEN

## 2011-11-16 MED ORDER — PROMETHAZINE HCL 25 MG PO TABS: 25.0000 mg | ORAL_TABLET | Freq: Four times a day (QID) | ORAL | Status: AC | PRN

## 2011-11-16 MED ORDER — FLUCONAZOLE 150 MG PO TABS: 150.0000 mg | ORAL_TABLET | Freq: Once | ORAL | Status: AC

## 2011-11-16 NOTE — ED Provider Notes (Signed)
History   Pt presents today c/o lower abd pain and lower back pain for the past several weeks. She states the pain has worsened this past week and she became concerned. She also c/o some nausea and clear vag dc. She states she has always had irregular menses and is uncertain if she is pregnant.  Chief Complaint  Patient presents with  . Abdominal Pain  . Back Pain   HPI  OB History    Grav Para Term Preterm Abortions TAB SAB Ect Mult Living   5 2  2 1 1    2       Past Medical History  Diagnosis Date  . Abnormal Pap smear   . Kidney infection   . UTI (lower urinary tract infection)   . Trichimoniasis   . Anemia   . Anxiety   . Depression     Past Surgical History  Procedure Date  . Induced abortion     No family history on file.  History  Substance Use Topics  . Smoking status: Current Everyday Smoker -- 1.0 packs/day    Types: Cigarettes  . Smokeless tobacco: Not on file  . Alcohol Use: No    Allergies: No Known Allergies  No prescriptions prior to admission    Review of Systems  Constitutional: Negative for fever.  Cardiovascular: Negative for chest pain.  Gastrointestinal: Positive for nausea and abdominal pain. Negative for vomiting, diarrhea and constipation.  Genitourinary: Negative for dysuria, urgency, frequency and hematuria.  Neurological: Negative for dizziness and headaches.  Psychiatric/Behavioral: Negative for depression and suicidal ideas.   Physical Exam   Blood pressure 117/61, pulse 63, temperature 98 F (36.7 C), temperature source Oral, resp. rate 18, height 5\' 7"  (1.702 m), weight 159 lb 9.6 oz (72.394 kg), last menstrual period 08/27/2011, SpO2 99.00%.  Physical Exam  Nursing note and vitals reviewed. Constitutional: She is oriented to person, place, and time. She appears well-developed and well-nourished. No distress.  HENT:  Head: Normocephalic and atraumatic.  Eyes: EOM are normal. Pupils are equal, round, and reactive to light.   GI: Soft. She exhibits no distension and no mass. There is tenderness. There is no rebound and no guarding.  Genitourinary: No bleeding around the vagina. Vaginal discharge found.       Cervix Lg/closed. No palpable adnexal masses.  Neurological: She is alert and oriented to person, place, and time.  Skin: Skin is warm and dry. She is not diaphoretic.  Psychiatric: She has a normal mood and affect. Her behavior is normal. Judgment and thought content normal.    MAU Course  Procedures  Wet prep and GC/Chlamydia cultures done.  Results for orders placed during the hospital encounter of 11/16/11 (from the past 24 hour(s))  URINALYSIS, ROUTINE W REFLEX MICROSCOPIC     Status: Abnormal   Collection Time   11/16/11 12:34 PM      Component Value Range   Color, Urine YELLOW  YELLOW    APPearance CLEAR  CLEAR    Specific Gravity, Urine >1.030 (*) 1.005 - 1.030    pH 6.0  5.0 - 8.0    Glucose, UA NEGATIVE  NEGATIVE (mg/dL)   Hgb urine dipstick NEGATIVE  NEGATIVE    Bilirubin Urine NEGATIVE  NEGATIVE    Ketones, ur NEGATIVE  NEGATIVE (mg/dL)   Protein, ur NEGATIVE  NEGATIVE (mg/dL)   Urobilinogen, UA 0.2  0.0 - 1.0 (mg/dL)   Nitrite NEGATIVE  NEGATIVE    Leukocytes, UA NEGATIVE  NEGATIVE  POCT PREGNANCY, URINE     Status: Normal   Collection Time   11/16/11 12:42 PM      Component Value Range   Preg Test, Ur POSITIVE    WET PREP, GENITAL     Status: Abnormal   Collection Time   11/16/11  1:20 PM      Component Value Range   Yeast, Wet Prep FEW (*) NONE SEEN    Trich, Wet Prep NONE SEEN  NONE SEEN    Clue Cells, Wet Prep NONE SEEN  NONE SEEN    WBC, Wet Prep HPF POC FEW (*) NONE SEEN    US shows single IUP with cardiac activity. EGA of 11.4wks and an EDC of 06/02/12. Assessment and Plan  Yeast: discussed with pt at length. Will tx with diflucan.  IUP: discussed with pt at length. She will begin prenatal care. Discussed diet, activity, risks, and precautions.  Clinton Gallant. Rice  III, DrHSc, MPAS, PA-C  11/16/2011, 1:28 PM   Henrietta Hoover, PA 11/16/11 1437

## 2011-11-16 NOTE — Progress Notes (Signed)
Patient states she has been having irregular periods for over 6 months, last period 9-21. Has been having bilateral flank pain for a long time and lower to mid abdominal pain for about one week.

## 2011-11-17 LAB — GC/CHLAMYDIA PROBE AMP, GENITAL
Chlamydia, DNA Probe: NEGATIVE
GC Probe Amp, Genital: NEGATIVE

## 2011-12-07 NOTE — L&D Delivery Note (Signed)
About one hour after amp was given, Pt found to be C/C/+2. AROM clear initially but then became bloody. Patient pushed for 2 minutes with epidural.   NSVD  female infant, Apgars 9,9, NICU in attendence.   The patient had no lacerations. Fundus was firm. EBL was expected. Placenta was delivered intact but had some clot associated with the edge; sent to path. Vagina was clear.  Baby was vigorous and taken to NICU.  [name] [name]

## 2012-02-28 ENCOUNTER — Other Ambulatory Visit: Payer: Self-pay

## 2012-04-09 ENCOUNTER — Encounter (HOSPITAL_COMMUNITY): Payer: Self-pay | Admitting: Anesthesiology

## 2012-04-09 ENCOUNTER — Encounter (HOSPITAL_COMMUNITY): Payer: Self-pay | Admitting: *Deleted

## 2012-04-09 ENCOUNTER — Inpatient Hospital Stay (HOSPITAL_COMMUNITY)
Admission: AD | Admit: 2012-04-09 | Discharge: 2012-04-12 | DRG: 775 | Disposition: A | Discharge status: Home / Self Care | Payer: Medicaid Other | Source: Ambulatory Visit | Attending: Obstetrics and Gynecology | Admitting: Obstetrics and Gynecology

## 2012-04-09 ENCOUNTER — Inpatient Hospital Stay (HOSPITAL_COMMUNITY): Payer: Medicaid Other | Admitting: Anesthesiology

## 2012-04-09 LAB — CBC
MCV: 94.8 fL (ref 78.0–100.0)
Platelets: 267 10*3/uL (ref 150–400)
RBC: 3.26 MIL/uL — ABNORMAL LOW (ref 3.87–5.11)
WBC: 25.3 10*3/uL — ABNORMAL HIGH (ref 4.0–10.5)

## 2012-04-09 LAB — URINALYSIS, ROUTINE W REFLEX MICROSCOPIC
Bilirubin Urine: NEGATIVE
Ketones, ur: NEGATIVE mg/dL
Nitrite: NEGATIVE
Protein, ur: NEGATIVE mg/dL
Specific Gravity, Urine: 1.015 (ref 1.005–1.030)
Urobilinogen, UA: 1 mg/dL (ref 0.0–1.0)

## 2012-04-09 LAB — URINE MICROSCOPIC-ADD ON

## 2012-04-09 MED ORDER — OXYCODONE-ACETAMINOPHEN 5-325 MG PO TABS: 1.0000 | ORAL_TABLET | ORAL | Status: DC | PRN

## 2012-04-09 MED ORDER — MAGNESIUM HYDROXIDE 400 MG/5ML PO SUSP: 30.0000 mL | ORAL | Status: DC | PRN

## 2012-04-09 MED ORDER — SODIUM BICARBONATE 8.4 % IV SOLN
INTRAVENOUS | Status: DC | PRN
  Administered 2012-04-09: 4 mL via EPIDURAL

## 2012-04-09 MED ORDER — ONDANSETRON HCL 4 MG PO TABS: 4.0000 mg | ORAL_TABLET | ORAL | Status: DC | PRN

## 2012-04-09 MED ORDER — SIMETHICONE 80 MG PO CHEW: 80.0000 mg | CHEWABLE_TABLET | ORAL | Status: DC | PRN

## 2012-04-09 MED ORDER — MEASLES, MUMPS & RUBELLA VAC ~~LOC~~ INJ: 0.5000 mL | INJECTION | Freq: Once | SUBCUTANEOUS | Status: DC

## 2012-04-09 MED ORDER — PHENYLEPHRINE 40 MCG/ML (10ML) SYRINGE FOR IV PUSH (FOR BLOOD PRESSURE SUPPORT)
PREFILLED_SYRINGE | INTRAVENOUS | Status: AC
  Filled 2012-04-09: qty 5

## 2012-04-09 MED ORDER — MAGNESIUM SULFATE 40 G IN LACTATED RINGERS - SIMPLE
2.0000 g/h | INTRAVENOUS | Status: DC
  Filled 2012-04-09: qty 500

## 2012-04-09 MED ORDER — BETAMETHASONE SOD PHOS & ACET 6 (3-3) MG/ML IJ SUSP
12.0000 mg | Freq: Once | INTRAMUSCULAR | Status: AC
  Administered 2012-04-09: 12 mg via INTRAMUSCULAR
  Filled 2012-04-09: qty 2

## 2012-04-09 MED ORDER — FLEET ENEMA 7-19 GM/118ML RE ENEM: 1.0000 | ENEMA | RECTAL | Status: DC | PRN

## 2012-04-09 MED ORDER — SODIUM CHLORIDE 0.9 % IV SOLN: 250.0000 mL | INTRAVENOUS | Status: DC | PRN

## 2012-04-09 MED ORDER — METHYLERGONOVINE MALEATE 0.2 MG/ML IJ SOLN: 0.2000 mg | INTRAMUSCULAR | Status: DC | PRN

## 2012-04-09 MED ORDER — SODIUM CHLORIDE 0.9 % IJ SOLN: 3.0000 mL | INTRAMUSCULAR | Status: DC | PRN

## 2012-04-09 MED ORDER — PRENATAL MULTIVITAMIN CH
1.0000 | ORAL_TABLET | Freq: Every day | ORAL | Status: DC
  Administered 2012-04-10 – 2012-04-11 (×2): 1 via ORAL
  Filled 2012-04-09 (×3): qty 1

## 2012-04-09 MED ORDER — PRENATAL MULTIVITAMIN CH: 1.0000 | ORAL_TABLET | Freq: Every day | ORAL | Status: DC

## 2012-04-09 MED ORDER — MAGNESIUM SULFATE BOLUS VIA INFUSION
4.0000 g | Freq: Once | INTRAVENOUS | Status: DC
  Filled 2012-04-09: qty 500

## 2012-04-09 MED ORDER — FENTANYL 2.5 MCG/ML BUPIVACAINE 1/10 % EPIDURAL INFUSION (WH - ANES)
INTRAMUSCULAR | Status: DC | PRN
  Administered 2012-04-09: 14 mL/h via EPIDURAL

## 2012-04-09 MED ORDER — OXYTOCIN BOLUS FROM INFUSION
500.0000 mL | Freq: Once | INTRAVENOUS | Status: DC
  Filled 2012-04-09: qty 1000
  Filled 2012-04-09: qty 500

## 2012-04-09 MED ORDER — OXYTOCIN 20 UNITS IN LACTATED RINGERS INFUSION - SIMPLE
125.0000 mL/h | Freq: Once | INTRAVENOUS | Status: AC
  Administered 2012-04-09: 999 mL/h via INTRAVENOUS

## 2012-04-09 MED ORDER — OXYCODONE-ACETAMINOPHEN 5-325 MG PO TABS
1.0000 | ORAL_TABLET | ORAL | Status: DC | PRN
  Administered 2012-04-09: 1 via ORAL
  Administered 2012-04-10 – 2012-04-11 (×5): 2 via ORAL
  Filled 2012-04-09: qty 1
  Filled 2012-04-09 (×3): qty 2
  Filled 2012-04-09 (×3): qty 1

## 2012-04-09 MED ORDER — DIPHENHYDRAMINE HCL 25 MG PO CAPS: 25.0000 mg | ORAL_CAPSULE | Freq: Four times a day (QID) | ORAL | Status: DC | PRN

## 2012-04-09 MED ORDER — ONDANSETRON HCL 4 MG/2ML IJ SOLN: 4.0000 mg | Freq: Four times a day (QID) | INTRAMUSCULAR | Status: DC | PRN

## 2012-04-09 MED ORDER — FERROUS SULFATE 325 (65 FE) MG PO TABS
325.0000 mg | ORAL_TABLET | Freq: Two times a day (BID) | ORAL | Status: DC
  Administered 2012-04-10 – 2012-04-12 (×3): 325 mg via ORAL
  Filled 2012-04-09 (×3): qty 1

## 2012-04-09 MED ORDER — LIDOCAINE HCL (PF) 1 % IJ SOLN: 30.0000 mL | INTRAMUSCULAR | Status: DC | PRN

## 2012-04-09 MED ORDER — DOCUSATE SODIUM 100 MG PO CAPS: 100.0000 mg | ORAL_CAPSULE | Freq: Every day | ORAL | Status: DC

## 2012-04-09 MED ORDER — ZOLPIDEM TARTRATE 5 MG PO TABS: 5.0000 mg | ORAL_TABLET | Freq: Every evening | ORAL | Status: DC | PRN

## 2012-04-09 MED ORDER — IBUPROFEN 600 MG PO TABS: 600.0000 mg | ORAL_TABLET | Freq: Four times a day (QID) | ORAL | Status: DC | PRN

## 2012-04-09 MED ORDER — FENTANYL 2.5 MCG/ML BUPIVACAINE 1/10 % EPIDURAL INFUSION (WH - ANES)
INTRAMUSCULAR | Status: AC
  Filled 2012-04-09: qty 60

## 2012-04-09 MED ORDER — ZOLPIDEM TARTRATE 10 MG PO TABS: 10.0000 mg | ORAL_TABLET | Freq: Every evening | ORAL | Status: DC | PRN

## 2012-04-09 MED ORDER — ONDANSETRON HCL 4 MG/2ML IJ SOLN: 4.0000 mg | INTRAMUSCULAR | Status: DC | PRN

## 2012-04-09 MED ORDER — DIBUCAINE 1 % RE OINT: 1.0000 "application " | TOPICAL_OINTMENT | RECTAL | Status: DC | PRN

## 2012-04-09 MED ORDER — SENNOSIDES-DOCUSATE SODIUM 8.6-50 MG PO TABS
2.0000 | ORAL_TABLET | Freq: Every day | ORAL | Status: DC
  Administered 2012-04-09 – 2012-04-12 (×3): 2 via ORAL

## 2012-04-09 MED ORDER — WITCH HAZEL-GLYCERIN EX PADS
1.0000 | MEDICATED_PAD | CUTANEOUS | Status: DC | PRN
Start: 2012-04-09 — End: 2012-04-12

## 2012-04-09 MED ORDER — OXYTOCIN 20 UNITS IN LACTATED RINGERS INFUSION - SIMPLE
125.0000 mL/h | INTRAVENOUS | Status: DC | PRN
  Filled 2012-04-09: qty 1000

## 2012-04-09 MED ORDER — EPHEDRINE 5 MG/ML INJ
INTRAVENOUS | Status: AC
  Filled 2012-04-09: qty 4

## 2012-04-09 MED ORDER — ACETAMINOPHEN 325 MG PO TABS: 650.0000 mg | ORAL_TABLET | ORAL | Status: DC | PRN

## 2012-04-09 MED ORDER — BENZOCAINE-MENTHOL 20-0.5 % EX AERO: 1.0000 "application " | INHALATION_SPRAY | CUTANEOUS | Status: DC | PRN

## 2012-04-09 MED ORDER — BUTORPHANOL TARTRATE 2 MG/ML IJ SOLN: 1.0000 mg | INTRAMUSCULAR | Status: DC | PRN

## 2012-04-09 MED ORDER — SODIUM CHLORIDE 0.9 % IV SOLN
2.0000 g | Freq: Four times a day (QID) | INTRAVENOUS | Status: DC
  Administered 2012-04-09: 2 g via INTRAVENOUS
  Filled 2012-04-09 (×3): qty 2000

## 2012-04-09 MED ORDER — LACTATED RINGERS IV SOLN
INTRAVENOUS | Status: DC
  Administered 2012-04-09: 15:00:00 via INTRAVENOUS

## 2012-04-09 MED ORDER — METHYLERGONOVINE MALEATE 0.2 MG PO TABS: 0.2000 mg | ORAL_TABLET | ORAL | Status: DC | PRN

## 2012-04-09 MED ORDER — IBUPROFEN 800 MG PO TABS
800.0000 mg | ORAL_TABLET | Freq: Three times a day (TID) | ORAL | Status: DC
  Administered 2012-04-09 – 2012-04-12 (×8): 800 mg via ORAL
  Filled 2012-04-09 (×8): qty 1

## 2012-04-09 MED ORDER — LACTATED RINGERS IV SOLN: 500.0000 mL | INTRAVENOUS | Status: DC | PRN

## 2012-04-09 MED ORDER — SODIUM CHLORIDE 0.9 % IJ SOLN: 3.0000 mL | Freq: Two times a day (BID) | INTRAMUSCULAR | Status: DC

## 2012-04-09 MED ORDER — CITRIC ACID-SODIUM CITRATE 334-500 MG/5ML PO SOLN: 30.0000 mL | ORAL | Status: DC | PRN

## 2012-04-09 MED ORDER — LANOLIN HYDROUS EX OINT: TOPICAL_OINTMENT | CUTANEOUS | Status: DC | PRN

## 2012-04-09 MED ORDER — TETANUS-DIPHTH-ACELL PERTUSSIS 5-2.5-18.5 LF-MCG/0.5 IM SUSP
0.5000 mL | Freq: Once | INTRAMUSCULAR | Status: AC
  Administered 2012-04-10: 0.5 mL via INTRAMUSCULAR
  Filled 2012-04-09: qty 0.5

## 2012-04-09 NOTE — Anesthesia Procedure Notes (Signed)

## 2012-04-09 NOTE — MAU Note (Signed)
Pt reports having contractions  On and off for the past 3 hours. Reports good fetal movement and denies SROM or vag bleeding.

## 2012-04-09 NOTE — MAU Provider Note (Signed)
JOLICIA DELIRA is a 26 y.o. female patient. No diagnosis found. Past Medical History  Diagnosis Date  . Abnormal Pap smear   . Kidney infection   . UTI (lower urinary tract infection)   . Trichimoniasis   . Anemia   . Anxiety   . Depression    OB History    Grav Para Term Preterm Abortions TAB SAB Ect Mult Living   5 2  2 1 1    2      [redacted]w[redacted]d Estimated Date of Delivery: 06/02/12 No Known Allergies Active Problems:  * No active hospital problems. *   Blood pressure 129/67, pulse 95, temperature 97.5 F (36.4 C), temperature source Oral, resp. rate 95, height 5\' 5"  (1.651 m), weight 167 lb 3.2 oz (75.841 kg), last menstrual period 08/27/2011.  History  [id number] at [redacted]w[redacted]d. Hx del at 32 wks x 2. Woke up with small amt bleeding, started having mild cramping at 10-11, ctx became regular and harder 2 hr go. Good fetal movement, no leaking. Last intercourse yesterday. Exam  cx 6-7 cm, 80% bulging bag, -2 per RN Assessment & Plan  Dr Henderson Cloud notified, will come see the pt.  Itzae Mccurdy, Olegario Messier M. 04/09/2012

## 2012-04-09 NOTE — H&P (Signed)
26 y.o. [redacted]w[redacted]d  [id number] comes in c/o ctxes.  Otherwise has good fetal movement and no bleeding.  Past Medical History  Diagnosis Date  . Abnormal Pap smear   . Kidney infection   . UTI (lower urinary tract infection)   . Trichimoniasis   . Anemia   . Anxiety   . Depression     Past Surgical History  Procedure Date  . Induced abortion     OB History    Grav Para Term Preterm Abortions TAB SAB Ect Mult Living   5 2  2 1 1    2      # Outc Date GA Lbr Len/2nd Wgt Sex Del Anes PTL Lv   1 CUR            2 GRA            Comments: System Generated. Please review and update pregnancy details.   3 PRE            4 PRE            5 TAB               History   Social History  . Marital Status: Single    Spouse Name: N/A    Number of Children: N/A  . Years of Education: N/A   Occupational History  . Not on file.   Social History Main Topics  . Smoking status: Current Everyday Smoker -- 1.0 packs/day    Types: Cigarettes  . Smokeless tobacco: Not on file  . Alcohol Use: No  . Drug Use: Yes    Special: Marijuana, Cocaine      past problem; pt is in therapy;  . Sexually Active:    Other Topics Concern  . Not on file   Social History Narrative  . No narrative on file   Review of patient's allergies indicates no known allergies.   Prenatal Course:  PNC started at 26 weeks.  Labs WNL.  Pt was not started on dellalutin.  Filed Vitals:   04/09/12 1343  BP: 129/67  Pulse: 95  Temp: 97.5 F (36.4 C)  Resp: 95     Lungs/Cor:  NAD Abdomen:  soft, gravid Ex:  no cords, erythema SVE:  7/C/-2; BBOW, VTX FHTs:  130s, good STV, NST R Toco:  q4 per patient   A/P   [redacted]w[redacted]d Preterm labor.  GBS unknown.  Lalonnie Shaffer A

## 2012-04-09 NOTE — Consult Note (Signed)
Neonatology Note:  Attendance at Delivery:  I was asked to attend this NSVD at 32 2/[redacted] weeks GA after onset of preterm labor today. The mother is a G4P2A1 A pos, GBS unknown who began receiving PNC at 26 weeks. She has a history of anxiety/panic attacks/depression, marijuana and cocaine use, smoked 1 pack/day during this pregnancy, and had Trichomoniasis and a UTI during the pregnancy. Today, she had a small amount of vaginal bleeding followed by onset of contractions. When seen in MAU, she was 7-8 cm dilated. She received a dose of Betamethasone and one dose of Ampicillin 2 hours prior to delivery. ROM just prior to delivery delivery, fluid clear. Infant vigorous with good spontaneous cry and tone. Needed only minimal bulb suctioning. Ap 9/9. Lungs clear to ausc in DR, no distress. He was held briefly by his mother in the DR, then was transported to the NICU in room air.  Virdia Ziesmer, MD  

## 2012-04-09 NOTE — Anesthesia Preprocedure Evaluation (Signed)

## 2012-04-09 NOTE — Progress Notes (Signed)
Patient to be transferred to L&D room 169

## 2012-04-09 NOTE — Progress Notes (Signed)
Followed patient from MAU and received patient in L&D room 169

## 2012-04-09 NOTE — Progress Notes (Signed)
External monitors off for epidural

## 2012-04-10 ENCOUNTER — Encounter (HOSPITAL_COMMUNITY): Payer: Self-pay | Admitting: *Deleted

## 2012-04-10 LAB — CBC
HCT: 29.3 % — ABNORMAL LOW (ref 36.0–46.0)
Hemoglobin: 9.8 g/dL — ABNORMAL LOW (ref 12.0–15.0)
MCH: 32.1 pg (ref 26.0–34.0)
MCHC: 33.4 g/dL (ref 30.0–36.0)
MCV: 96.1 fL (ref 78.0–100.0)

## 2012-04-10 LAB — RPR: RPR Ser Ql: NONREACTIVE

## 2012-04-10 MED ORDER — PNEUMOCOCCAL VAC POLYVALENT 25 MCG/0.5ML IJ INJ
0.5000 mL | INJECTION | INTRAMUSCULAR | Status: AC
  Administered 2012-04-11: 0.5 mL via INTRAMUSCULAR
  Filled 2012-04-10: qty 0.5

## 2012-04-10 NOTE — Plan of Care (Signed)
Problem: Phase II Progression Outcomes Goal: Afebrile, VS remain stable Outcome: Completed/Met Date Met:  04/10/12 VSS at this time

## 2012-04-10 NOTE — Plan of Care (Signed)
Problem: Consults Goal: Postpartum Patient Education (See Patient Education module for education specifics.) Outcome: Progressing Teaching has begun  Problem: Phase I Progression Outcomes Goal: Pain controlled with appropriate interventions Outcome: Completed/Met Date Met:  04/10/12 Good pain relief with Percocet and Motrin Goal: OOB as tolerated unless otherwise ordered Outcome: Progressing Has been up times two with help of Steady.Safety explained to patient and sheet signed. Goal: VS, stable, temp < 100.4 degrees F Outcome: Completed/Met Date Met:  04/10/12 VSS at this time Goal: Initial discharge plan identified Outcome: Completed/Met Date Met:  04/10/12 Pain control Assessment stable Understands self care Understands when to call the MD F/U care  Problem: Phase II Progression Outcomes Goal: Progress activity as tolerated unless otherwise ordered Outcome: Completed/Met Date Met:  04/10/12 Last time up with steady

## 2012-04-10 NOTE — Progress Notes (Signed)
Post Partum Day 1 Subjective: no complaints, up ad lib, voiding, tolerating PO and + flatus  Objective: Blood pressure 102/62, pulse 61, temperature 98.2 F (36.8 C), temperature source Oral, resp. rate 16, height 5\' 5"  (1.651 m), weight 75.841 kg (167 lb 3.2 oz), last menstrual period 08/27/2011, SpO2 99.00%, unknown if currently breastfeeding.  Physical Exam:  General: alert, cooperative and appears stated age Lochia: appropriate Uterine Fundus: firm   Basename 04/10/12 0520 04/09/12 1520  HGB 9.8* 10.3*  HCT 29.3* 30.9*    Assessment/Plan: Routine Postpartum care Pt not breastfeeding Baby in NICU, per patient doing well    LOS: 1 day   Izsak Meir [name]. 04/10/2012, 11:10 AM

## 2012-04-10 NOTE — Anesthesia Postprocedure Evaluation (Signed)
  Anesthesia Post-op Note  Patient: [name] [name]  Procedure(s) Performed: * No surgery found *  Patient Location: [facility]  Anesthesia Type: Epidural  Level of Consciousness: awake  Airway and Oxygen Therapy: Patient Spontanous Breathing  Post-op Pain: none  Post-op Assessment: Patient's Cardiovascular Status Stable, Respiratory Function Stable, Patent Airway, No signs of Nausea or vomiting, Adequate PO intake, Pain level controlled, No headache, No backache, No residual numbness and No residual motor weakness  Post-op Vital Signs: Reviewed and stable  Complications: No apparent anesthesia complications

## 2012-04-10 NOTE — Progress Notes (Signed)
UR chart review completed.  

## 2012-04-11 LAB — CBC
MCH: 32.2 pg (ref 26.0–34.0)
MCHC: 32.9 g/dL (ref 30.0–36.0)
MCV: 98 fL (ref 78.0–100.0)
Platelets: 268 10*3/uL (ref 150–400)
RDW: 13.4 % (ref 11.5–15.5)

## 2012-04-11 NOTE — Progress Notes (Signed)
PPD#2 Pt without c/o. Lochia -wnl. Would like to stay another day with baby in NICU. VSSAF Pt's WBC increased to 30K PPD#1, asymptomatic Abd- nontender IMP/ stable Plan/ repeat CBC
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
## 2012-04-12 MED ORDER — IBUPROFEN 600 MG PO TABS: 600.0000 mg | ORAL_TABLET | Freq: Four times a day (QID) | ORAL | Status: AC | PRN

## 2012-04-12 NOTE — Discharge Instructions (Signed)
Pelvic Rest Pelvic rest is sometimes recommended for women when:   The placenta is partially or completely covering the opening of the cervix (placenta previa).   There is bleeding between the uterine wall and the amniotic sac in the first trimester (subchorionic hemorrhage).   The cervix begins to open without labor starting (incompetent cervix, cervical insufficiency).   The labor is too early (preterm labor).  HOME CARE INSTRUCTIONS  Do not have sexual intercourse, stimulation, or an orgasm.   Do not use tampons, douche, or put anything in the vagina.   Do not lift anything over 10 pounds (4.5 kg).   Avoid strenuous activity or straining your pelvic muscles.  SEEK MEDICAL CARE IF:  You have any vaginal bleeding during pregnancy. Treat this as a potential emergency.   You have cramping pain felt low in the stomach (stronger than menstrual cramps).   You notice vaginal discharge (watery, mucus, or bloody).   You have a low, dull backache.   There are regular contractions or uterine tightening.  SEEK IMMEDIATE MEDICAL CARE IF: You have vaginal bleeding and have placenta previa.  Document Released: 03/19/2011 Document Revised: 11/11/2011 Document Reviewed: 03/19/2011 [facility] Patient Information 2012 [place], [place].Prematurity, Complications The following are some of the problems the premature infant may encounter. Not all premature infants will develop these problems and some of them will have none of them. ANEMIA What is anemia? Anemia is a shortage of red blood cell. Red blood cells carry oxygen to the body tissues. Anemia results from the short life of red blood cells in an infant, and a preemie's delayed process of making new ones. Is anemia dangerous? If left untreated, anemia can lead to poor weight gain and growth, decreased activity, increased heart rate, and developmental delays. Babies suffering from anemia at 27 months of age have been found to perform poorly on  developmental tests, particularly those assessing motor skills. How is anemia treated? Anemia does not always need to be treated if it is not severe and if the baby is not sick or having frequent laboratory tests. If treatment is necessary, it's usually done through transfusions of red blood cells obtained from a blood bank. Anemia can also be treated with a drug similar to the substance the body normally produces to increase the number of red blood cells. As the baby grows, he may need an additional source of iron to speed up the production of red blood cells. APNEA What is apnea? Apnea is a common health problem in premature babies. Spells of apnea may involve a pause in breathing, a decrease in heart rate, or a change in skin color. Apnea is caused by immaturity in the area of the brain that controls breathing. Almost all babies born at 30 weeks or less will experience apnea. These spells become less frequent with age. They usually disappear by time the baby nears his due date. Is apnea dangerous? Although highly unlikely, apnea could lead to a low blood oxygen content, which in turn could cause brain damage. Apnea can also cause strain on a baby's heart and lungs. Despite past fears, a newborn's apnea does not increase their risk of SIDS. How is apnea treated? Treating apnea can be as simple as gently stimulating the infant to restart breathing. However, when apnea occurs frequently, the infant may require medication, a ventilator, or a nasal device that blows a steady stream of air into her airways. BLOOD SUGAR ABNORMALITIES What causes blood sugar abnormalities? A newborn preemie's blood sugar may  be either too low or too high. Low blood sugar is common soon after birth. High blood sugar is more common in babies who are getting most or all of their nutrition intravenously. Are blood sugar abnormalities dangerous? If left untreated for a number of years, blood sugar fluctuations can lead to serious  problems in baby's eyes, kidneys, nerves, gums and teeth, and blood vessels. However, once a baby is feeding regularly, blood sugar problems seldom recur. Blood sugar fluctuations in preemies do not indicate the development of diabetes later in life. How are blood sugar abnormalities treated? Low blood sugar is treated by increasing baby's sugar intake, either by sugar water through an IV or by more frequent feedings. High blood sugar is treated by decreasing the sugar in IV fluids or by providing the baby with insulin. FEEDING PROBLEMS What causes feeding problems? Premature babies are often unable to coordinate their sucking and breathing. This can lead to breathing difficulties when they try to feed through breast or bottle. A healthy sucking and breathing coordination usually forms at around 34 weeks after gestation. This allows a baby to begin breastfeeding or bottle feeding normally. Are feeding problems dangerous? If a baby is unable to feed through sucking, the problem is recognized quickly and the baby is fed nutrients in other ways. It is healthy and normal for a baby to lose weight in the first few weeks after birth, since newborns release water that was retained in the womb. Weight loss is not a sign of malnutrition.  How are feeding problems treated? Babies who are unable to eat through sucking are fed nutrients through one of the following methods:  Total Parenteral Nutrition (TPN), providing protein, fat, sugar, vitamins, and minerals intravenously (through a vein)   Milk through a tube entering baby's nose or mouth   Drip feedings passing directly into baby's intestines  INFECTIONS What causes infections? There are two kinds of infections that preemies are susceptible to:  Generalized infection (infection of the blood)   Localized infections under the skin  Infections are a bigger threat to premature infants because they are less able than full-term infants to fight germs.  Infections in babies can be caused by bacteria, viruses, or fungi, and can start before birth, near the time of birth, or while the baby is in the nursery. Are infections dangerous? Most of the time the baby's infection responds rapidly to antibiotics and there are no long-term problems. Long-term problems are more likely if the baby has meningitis, or if he or she experiences severe low blood pressure for a long period of time. How are infections treated? Bacterial infections can be treated with antibiotics. Other medications are prescribed to treat viral and fungal infections. Frequent hand washing by anyone who comes into contact with your baby will also reduce the risk of infection. LOW BLOOD PRESSURE What causes low blood pressure? Low blood pressure, or hypotension, is a relatively common complication that may occur shortly after birth. It refers to poor circulatory function which results in inadequate blood flow to the heart, brain, and other vital organs. In a newborn, low blood pressure can be due to infection, blood loss, fluid loss, or medications given to the mother before delivery. Is low blood pressure dangerous? It's not dangerous if it's treated early. Low blood pressure can lessen the body's ability to circulate its nutrients and clean its wastes. It can also result in heart problems if left untreated. How is low blood pressure treated? Low blood pressure is  usually treated with medication or by increasing fluid intake intravenously. Infants with low blood pressure due to blood loss may be given a blood transfusion. PATENT DUCTUS ARTERIOSUS What is patent ductus arteriosus (PDA)? The ductus is a blood vessel connecting the main vessel leading to the lungs (pulmonary artery) to the main vessel of the body (aorta). Its function in the unborn baby is to allow blood to bypass the lungs, since oxygen for the blood comes from the mother and not from breathing air. Normally after birth the ductus  gradually narrows and then closes in the first few hours to days, but in premature infants, this blood vessel may stay open. Is patent ductus arteriosus dangerous? The opening of this blood vessel causes too much blood to be pumped into the baby's lungs. This leads to an increase in fluid in the lungs and it makes it harder for the baby to breathe. PDA also increases the work of the heart and can lead to lung failure. How is patent ductus arteriosus treated? If the ductus is very small and there is only a tiny amount of blood flowing through it, the doctors may wait to see if it closes on its own. Treatment methods in more severe cases involve giving medications and decreasing baby's fluid intake. If all else fails, then surgery may be required to close the ductus. RESPIRATORY DISTRESS SYNDROME (RDS) Although there are many causes of breathing difficulties in premature infants, the most common one is RDS. RDS happens when the infant's immature lungs do not produce enough of an important substance called surfactant. A healthy amount of surfactant spreads like a film over the tiny air sacs of the lungs, allowing them to stay open. Open air sacs are essential for oxygen to enter the blood from the lungs and for carbon dioxide to be released from the blood into the lungs for exhalation (breathing out). Is RDS dangerous? Some possible long-term problems may come from RDS. If the case is severe or if there have been complications, possible problems may include:  Increased severity of colds or other respiratory infections   Sensitivity to lung irritants such as smoke and pollution   Infection of the bloodstream   Bleeding in the brain   Lung scarring   Greater likelihood of wheezing or other asthma-like problems  How is RDS treated? A baby with RDS will need extra oxygen. The added oxygen might be given by placing a plastic hood over the baby's head or through little tubes in the nostrils. If the RDS is  moderate or severe, your baby may need to have a breathing tube inserted into her wind pipe. She may also be given an artificial form of surfactant, replacing the substance that her lungs lack. RETINOPATHY OF PREMATURITY What is retinopathy of prematurity (ROP)? This complication is abnormal growth of the blood vessels in an infant's eye. The development of blood vessels in the eye is completed just a few weeks before the normal time of delivery. In premature babies this process is not complete. Is ROP dangerous? ROP can have long-term effects on a baby's vision. Premature infants more frequently need glasses in early childhood than children who were not premature. A baby with ROP is also more likely than most children to develop a "lazy" eye or wandering eye. Severe ROP can lead to blindness. How is ROP treated? Most cases of ROP do not require any treatment; they resolve on their own. In severe cases, the ends of the vessels in the  inner lining of the eye will be treated to prevent further abnormal growth. TRANSIENT TACHYPNEA What is transient tachypnea? Transient tachypnea is rapid breathing due to slow re-absorption of fetal lung fluid. In the womb, a baby's lungs continuously make fluid. Some of this fluid is squeezed out as the baby comes down the birth canal. The rest must be absorbed by the baby's tissues during the first minutes to hours of life. Is transient tachypnea dangerous? No. The breathing abnormalities may last hours or days, but should disappear on their own and will not present long-term problems. How is transient tachypnea treated? A baby with transient tachypnea will have his or her respiration, heart rate, and oxygenation watched closely. Some babies may also need additional oxygen to be given through a hood or a small tube inserted in baby's nose, which can help keep the fluid out of the lungs' air sacs and speed up its reabsorption. If your baby develops any of the above problems,  your caregiver will go over these problems with you and help you understand them. Ask questions about anything you are unsure of. Document Released: 11/23/2004 Document Revised: 08/04/2011 Document Reviewed: 11/20/2008 [facility] Patient Information 2012 [place], [place].

## 2012-04-12 NOTE — Discharge Summary (Signed)
Obstetric Discharge Summary Reason for Admission: onset of labor Prenatal Procedures: ultrasound Intrapartum Procedures: spontaneous vaginal delivery Postpartum Procedures: none Complications-Operative and Postpartum: none Hemoglobin  Date Value Range Status  04/11/2012 9.7* 12.0-15.0 (g/dL) Final     HCT  Date Value Range Status  04/11/2012 29.5* 36.0-46.0 (%) Final    Physical Exam:  General: alert, cooperative and appears stated age Lochia: appropriate Uterine Fundus: firm  Discharge Diagnoses: Preterm labor with delivery  Discharge Information: Date: 04/12/2012 Activity: pelvic rest Diet: routine Medications: Ibuprofen Condition: stable Instructions: refer to practice specific booklet Discharge to: home Follow-up Information    Follow up with HORVATH,MICHELLE A, MD. (For your postpartum evaluation)    Contact information:   [address] [place] [address] [phone]          Newborn Data: Live born female  Birth Weight: 3 lb 9.9 oz (1640 g) APGAR: 9, 9  Baby in NICU  Cal Gindlesperger H. 04/12/2012, 9:52 AM

## 2012-07-05 ENCOUNTER — Encounter (HOSPITAL_COMMUNITY): Payer: Self-pay | Admitting: Pharmacist

## 2012-07-11 ENCOUNTER — Encounter (HOSPITAL_COMMUNITY): Admission: RE | Payer: Self-pay | Source: Ambulatory Visit

## 2012-07-11 ENCOUNTER — Ambulatory Visit (HOSPITAL_COMMUNITY)
Admission: RE | Admit: 2012-07-11 | Payer: Medicaid Other | Source: Ambulatory Visit | Admitting: Obstetrics & Gynecology

## 2012-07-11 SURGERY — LIGATION, FALLOPIAN TUBE, LAPAROSCOPIC
Anesthesia: Choice | Laterality: Bilateral

## 2012-07-11 MED ORDER — MUPIROCIN 2 % EX OINT
TOPICAL_OINTMENT | CUTANEOUS | Status: AC
  Filled 2012-07-11: qty 22

## 2013-04-28 ENCOUNTER — Encounter (HOSPITAL_COMMUNITY): Payer: Self-pay

## 2013-04-28 ENCOUNTER — Inpatient Hospital Stay (HOSPITAL_COMMUNITY)
Admission: AD | Admit: 2013-04-28 | Discharge: 2013-04-28 | Disposition: A | Discharge status: Home / Self Care | Payer: Self-pay | Source: Ambulatory Visit | Attending: Obstetrics & Gynecology | Admitting: Obstetrics & Gynecology

## 2013-04-28 DIAGNOSIS — R1032 Left lower quadrant pain: Secondary | ICD-10-CM

## 2013-04-28 DIAGNOSIS — R51 Headache: Secondary | ICD-10-CM | POA: Insufficient documentation

## 2013-04-28 DIAGNOSIS — R109 Unspecified abdominal pain: Secondary | ICD-10-CM | POA: Insufficient documentation

## 2013-04-28 DIAGNOSIS — K5900 Constipation, unspecified: Secondary | ICD-10-CM | POA: Insufficient documentation

## 2013-04-28 LAB — POCT PREGNANCY, URINE: Preg Test, Ur: NEGATIVE

## 2013-04-28 LAB — WET PREP, GENITAL: Yeast Wet Prep HPF POC: NONE SEEN

## 2013-04-28 LAB — RAPID URINE DRUG SCREEN, HOSP PERFORMED
Amphetamines: NOT DETECTED
Opiates: NOT DETECTED
Tetrahydrocannabinol: NOT DETECTED

## 2013-04-28 LAB — URINALYSIS, ROUTINE W REFLEX MICROSCOPIC
Nitrite: NEGATIVE
Protein, ur: NEGATIVE mg/dL
Specific Gravity, Urine: 1.025 (ref 1.005–1.030)
Urobilinogen, UA: 0.2 mg/dL (ref 0.0–1.0)

## 2013-04-28 MED ORDER — KETOROLAC TROMETHAMINE 60 MG/2ML IM SOLN
60.0000 mg | Freq: Once | INTRAMUSCULAR | Status: AC
  Administered 2013-04-28: 60 mg via INTRAMUSCULAR
  Filled 2013-04-28: qty 2

## 2013-04-28 MED ORDER — PROMETHAZINE HCL 25 MG PO TABS
25.0000 mg | ORAL_TABLET | Freq: Once | ORAL | Status: AC
  Administered 2013-04-28: 25 mg via ORAL
  Filled 2013-04-28: qty 1

## 2013-04-28 NOTE — MAU Provider Note (Signed)
History     CSN: [id number]  Arrival date and time: 04/28/13 1615   First Provider Initiated Contact with Patient 04/28/13 1712      Chief Complaint  Patient presents with  . Abdominal Pain   HPI [name] [name] 27 y.o. Client comes to MAU with a headache since this morning early.  Took 2 Ultram tablets from her sister to help with the headache which was throbbing and  9/10 in severity.  Has noticed today that her abdomen is swelling.  She has pain in her mid back which radiates from the back around to her sides and then toward the lower midline abdomen and "goes down to her cervix".  She feels fluttery in her head, chest and through her arms and hands.  She had a hard BM this morning.  She has had nausea and the headache has continued.  She took an Excedrin about 1.5 hours ago.  Now her headache is 5/10.  OB History   Grav Para Term Preterm Abortions TAB SAB Ect Mult Living   5 3  3 1 1    3       Past Medical History  Diagnosis Date  . Abnormal Pap smear   . Kidney infection   . UTI (lower urinary tract infection)   . Trichimoniasis   . Anemia   . Anxiety   . Depression     Past Surgical History  Procedure Laterality Date  . Induced abortion      No family history on file.  History  Substance Use Topics  . Smoking status: Current Every Day Smoker -- 1.00 packs/day    Types: Cigarettes  . Smokeless tobacco: Not on file  . Alcohol Use: No    Allergies: No Known Allergies  Prescriptions prior to admission  Medication Sig Dispense Refill  . aspirin-acetaminophen-caffeine (EXCEDRIN MIGRAINE) 250-250-65 MG per tablet Take 2 tablets by mouth every 6 (six) hours as needed (headache).      . Multiple Vitamin (MULTIVITAMIN WITH MINERALS) TABS Take 1 tablet by mouth daily.      . traMADol (ULTRAM) 50 MG tablet Take 50 mg by mouth every 6 (six) hours as needed for pain.        Review of Systems  Constitutional: Negative for fever.  Gastrointestinal: Positive for  nausea, abdominal pain and constipation. Negative for vomiting and diarrhea.  Genitourinary:       No vaginal discharge. No vaginal bleeding. No dysuria.  Musculoskeletal: Positive for back pain.  Neurological: Positive for sensory change and headaches.       Feels fluttery in her head, chest, arms and abdomen.   Physical Exam   Blood pressure 141/69, pulse 65, temperature 98.1 F (36.7 C), temperature source Oral, resp. rate 18, height 5\' 7"  (1.702 m), weight 171 lb 6.4 oz (77.747 kg), last menstrual period 04/17/2013.  Physical Exam  Nursing note and vitals reviewed. Constitutional: She is oriented to person, place, and time. She appears well-developed and well-nourished.  HENT:  Head: Normocephalic.  Eyes: EOM are normal.  Neck: Neck supple.  Cardiovascular: Normal rate, regular rhythm and normal heart sounds.   No murmur heard. Respiratory: Effort normal and breath sounds normal. No respiratory distress. She has no wheezes.  GI: Soft. Bowel sounds are normal. There is tenderness. There is no rebound and no guarding.  Tenderness in LLQ and low midline.  Genitourinary:  Speculum exam: Vagina - Small amount of creamy discharge Cervix - No contact bleeding Bimanual exam: Cervix closed  Large amount of hard stool palpated through wall of posterior vagina Uterus mildly tender, normal size Adnexa non tender, no masses bilaterally GC/Chlam, wet prep done Chaperone present for exam.  Musculoskeletal: Normal range of motion.  Neurological: She is alert and oriented to person, place, and time.  Skin: Skin is warm and dry.  Psychiatric: She has a normal mood and affect.    MAU Course  Procedures Results for orders placed during the hospital encounter of 04/28/13 (from the past 24 hour(s))  URINALYSIS, ROUTINE W REFLEX MICROSCOPIC     Status: None   Collection Time    04/28/13  4:35 PM      Result Value Range   Color, Urine YELLOW  YELLOW   APPearance CLEAR  CLEAR   Specific  Gravity, Urine 1.025  1.005 - 1.030   pH 6.0  5.0 - 8.0   Glucose, UA NEGATIVE  NEGATIVE mg/dL   Hgb urine dipstick NEGATIVE  NEGATIVE   Bilirubin Urine NEGATIVE  NEGATIVE   Ketones, ur NEGATIVE  NEGATIVE mg/dL   Protein, ur NEGATIVE  NEGATIVE mg/dL   Urobilinogen, UA 0.2  0.0 - 1.0 mg/dL   Nitrite NEGATIVE  NEGATIVE   Leukocytes, UA NEGATIVE  NEGATIVE  POCT PREGNANCY, URINE     Status: None   Collection Time    04/28/13  4:43 PM      Result Value Range   Preg Test, Ur NEGATIVE  NEGATIVE  WET PREP, GENITAL     Status: Abnormal   Collection Time    04/28/13  5:05 PM      Result Value Range   Yeast Wet Prep HPF POC NONE SEEN  NONE SEEN   Trich, Wet Prep NONE SEEN  NONE SEEN   Clue Cells Wet Prep HPF POC MODERATE (*) NONE SEEN   WBC, Wet Prep HPF POC FEW (*) NONE SEEN    MDM Client denied any problems with vaginal discharge so not planning to treat for BV.  Main problem is abdominal pain and bloating which is likely due to constipation.  Urine drug screen still pending.  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  Toradol 60 mg IM for pain and Phenergan 25 mg PO for nausea.  Assessment and Plan  Abdominal pain Constipation  Plan Drink at least 8 8-oz glasses of water every day. Eat a high fiber diet. Take an over the counter stool softener - docusate sodium - by the package directions. Return if you have worsening symptoms or fever.   BURLESON,TERRI 04/28/2013, 5:12 PM

## 2013-04-28 NOTE — MAU Note (Addendum)
Pt reports she has had abd pain and swelling and back pain since about 6am , 30 min after she ate breakfast. She is also c/o headache/fluttery feeling in her head. Took Excedrin fro headache about 1 hr ago no releif

## 2013-05-01 LAB — GC/CHLAMYDIA PROBE AMP
CT Probe RNA: NEGATIVE
GC Probe RNA: NEGATIVE

## 2013-05-30 ENCOUNTER — Encounter (HOSPITAL_COMMUNITY): Payer: Self-pay

## 2013-05-30 ENCOUNTER — Emergency Department (HOSPITAL_COMMUNITY)
Admission: EM | Admit: 2013-05-30 | Discharge: 2013-05-30 | Disposition: A | Discharge status: Home / Self Care | Payer: Self-pay | Attending: Emergency Medicine | Admitting: Emergency Medicine

## 2013-05-30 DIAGNOSIS — F17200 Nicotine dependence, unspecified, uncomplicated: Secondary | ICD-10-CM | POA: Insufficient documentation

## 2013-05-30 DIAGNOSIS — Z87448 Personal history of other diseases of urinary system: Secondary | ICD-10-CM | POA: Insufficient documentation

## 2013-05-30 DIAGNOSIS — Z87440 Personal history of urinary (tract) infections: Secondary | ICD-10-CM | POA: Insufficient documentation

## 2013-05-30 DIAGNOSIS — Z79899 Other long term (current) drug therapy: Secondary | ICD-10-CM | POA: Insufficient documentation

## 2013-05-30 DIAGNOSIS — R1032 Left lower quadrant pain: Secondary | ICD-10-CM | POA: Insufficient documentation

## 2013-05-30 DIAGNOSIS — R109 Unspecified abdominal pain: Secondary | ICD-10-CM

## 2013-05-30 DIAGNOSIS — Z8659 Personal history of other mental and behavioral disorders: Secondary | ICD-10-CM | POA: Insufficient documentation

## 2013-05-30 DIAGNOSIS — Z862 Personal history of diseases of the blood and blood-forming organs and certain disorders involving the immune mechanism: Secondary | ICD-10-CM | POA: Insufficient documentation

## 2013-05-30 DIAGNOSIS — Z8619 Personal history of other infectious and parasitic diseases: Secondary | ICD-10-CM | POA: Insufficient documentation

## 2013-05-30 LAB — LIPASE, BLOOD: Lipase: 23 U/L (ref 11–59)

## 2013-05-30 LAB — CBC WITH DIFFERENTIAL/PLATELET
Basophils Absolute: 0 10*3/uL (ref 0.0–0.1)
Basophils Relative: 0 % (ref 0–1)
Eosinophils Absolute: 0.2 10*3/uL (ref 0.0–0.7)
Eosinophils Relative: 2 % (ref 0–5)
HCT: 36.7 % (ref 36.0–46.0)
Hemoglobin: 12.9 g/dL (ref 12.0–15.0)
MCH: 32.8 pg (ref 26.0–34.0)
MCHC: 35.1 g/dL (ref 30.0–36.0)
Monocytes Absolute: 1 10*3/uL (ref 0.1–1.0)
Monocytes Relative: 9 % (ref 3–12)
Neutro Abs: 5.9 10*3/uL (ref 1.7–7.7)
RDW: 12.3 % (ref 11.5–15.5)

## 2013-05-30 LAB — URINALYSIS, ROUTINE W REFLEX MICROSCOPIC
Hgb urine dipstick: NEGATIVE
Protein, ur: NEGATIVE mg/dL
Urobilinogen, UA: 0.2 mg/dL (ref 0.0–1.0)

## 2013-05-30 LAB — COMPREHENSIVE METABOLIC PANEL
AST: 13 U/L (ref 0–37)
Albumin: 4.1 g/dL (ref 3.5–5.2)
BUN: 13 mg/dL (ref 6–23)
Calcium: 9.2 mg/dL (ref 8.4–10.5)
Creatinine, Ser: 0.75 mg/dL (ref 0.50–1.10)
Total Bilirubin: 0.6 mg/dL (ref 0.3–1.2)
Total Protein: 7.1 g/dL (ref 6.0–8.3)

## 2013-05-30 LAB — WET PREP, GENITAL
Clue Cells Wet Prep HPF POC: NONE SEEN
Trich, Wet Prep: NONE SEEN
Yeast Wet Prep HPF POC: NONE SEEN

## 2013-05-30 MED ORDER — MORPHINE SULFATE 4 MG/ML IJ SOLN
2.0000 mg | Freq: Once | INTRAMUSCULAR | Status: AC
  Administered 2013-05-30: 2 mg via INTRAVENOUS

## 2013-05-30 MED ORDER — SODIUM CHLORIDE 0.9 % IV BOLUS (SEPSIS)
1000.0000 mL | Freq: Once | INTRAVENOUS | Status: AC
  Administered 2013-05-30: 1000 mL via INTRAVENOUS

## 2013-05-30 MED ORDER — MORPHINE SULFATE 4 MG/ML IJ SOLN
2.0000 mg | Freq: Once | INTRAMUSCULAR | Status: AC
  Administered 2013-05-30: 2 mg via INTRAVENOUS
  Filled 2013-05-30: qty 1

## 2013-05-30 MED ORDER — LIDOCAINE HCL (PF) 1 % IJ SOLN
INTRAMUSCULAR | Status: AC
  Administered 2013-05-30: 5 mL
  Filled 2013-05-30: qty 5

## 2013-05-30 MED ORDER — AZITHROMYCIN 1 G PO PACK
1.0000 g | PACK | Freq: Once | ORAL | Status: AC
  Administered 2013-05-30: 1 g via ORAL
  Filled 2013-05-30: qty 1

## 2013-05-30 MED ORDER — CEFTRIAXONE SODIUM 250 MG IJ SOLR
250.0000 mg | Freq: Once | INTRAMUSCULAR | Status: AC
  Administered 2013-05-30: 250 mg via INTRAMUSCULAR
  Filled 2013-05-30: qty 250

## 2013-05-30 NOTE — ED Notes (Signed)
Patient presents with c/o left lower quadrant pain. Woke up with this pain around 0530 this AM. Patient was without any c/o prior to going to bed last night. Denies any N/V, diarrhea or constipation. LBM 1 day ago. No dysuria or hematuria. No vaginal discharge or odor. No urinary frequency or urgency. No flank pain or back pain. No CP or SOB.

## 2013-05-30 NOTE — ED Notes (Signed)
PA at bedside.

## 2013-05-30 NOTE — ED Provider Notes (Signed)
History    CSN: [id number] Arrival date & time 05/30/13  2952  First MD Initiated Contact with Patient 05/30/13 [phone]     Chief Complaint  Patient presents with  . Abdominal Pain   (Consider location/radiation/quality/duration/timing/severity/associated sxs/prior Treatment) HPI  Patient is a 27 year old female past medical history significant for anxiety, anemia, depression presenting to the emergency department for sudden onset nonradiating sharp left lower quadrant abdominal pain that began this morning. Patient states pain as a 9/10. Aggravating factors include walking. Alleviating factors include lying down. Patient denies any fevers, chills, nausea, vomiting, diarrhea, constipation, vaginal bleeding or discharge, pelvic pain. Last menstrual period was June 13th with history of regular menstrual cycles. Patient surgical history includes induced abortion 2 years ago.   Past Medical History  Diagnosis Date  . Abnormal Pap smear   . Kidney infection   . UTI (lower urinary tract infection)   . Trichimoniasis   . Anemia   . Anxiety   . Depression    Past Surgical History  Procedure Laterality Date  . Induced abortion     No family history on file. History  Substance Use Topics  . Smoking status: Current Every Day Smoker -- 1.00 packs/day    Types: Cigarettes  . Smokeless tobacco: Never Used  . Alcohol Use: No     Comment: occasional    OB History   Grav Para Term Preterm Abortions TAB SAB Ect Mult Living   5 3  3 1 1    3      Review of Systems  Constitutional: Negative for fever and chills.  Gastrointestinal: Positive for abdominal pain. Negative for nausea, vomiting, diarrhea, constipation, blood in stool, abdominal distention, anal bleeding and rectal pain.  All other systems reviewed and are negative.    Allergies  Review of patient's allergies indicates no known allergies.  Home Medications   Current Outpatient Rx  Name  Route  Sig  Dispense  Refill  .  aspirin-acetaminophen-caffeine (EXCEDRIN MIGRAINE) 250-250-65 MG per tablet   Oral   Take 2 tablets by mouth every 6 (six) hours as needed (headache).         . Multiple Vitamin (MULTIVITAMIN WITH MINERALS) TABS   Oral   Take 1 tablet by mouth daily.          BP 107/50  Pulse 74  Temp(Src) 97.7 F (36.5 C) (Oral)  Resp 16  SpO2 100%  LMP 05/18/2013  Breastfeeding? No Physical Exam  Constitutional: She is oriented to person, place, and time. She appears well-developed and well-nourished. No distress.  HENT:  Head: Normocephalic and atraumatic.  Mouth/Throat: Oropharynx is clear and moist.  Eyes: Conjunctivae are normal.  Neck: Neck supple.  Cardiovascular: Normal rate, regular rhythm and normal heart sounds.   Pulmonary/Chest: Effort normal and breath sounds normal.  Abdominal: Soft. Bowel sounds are normal. She exhibits no distension and no mass. There is no tenderness. There is no rigidity, no rebound and no guarding.  Neurological: She is alert and oriented to person, place, and time.  Skin: Skin is warm and dry. She is not diaphoretic.  Psychiatric: She has a normal mood and affect.   Exam performed by Francee Piccolo L,  exam chaperoned Date: 05/30/2013 Pelvic exam: normal external genitalia without evidence of trauma. VULVA: normal appearing vulva with no masses, tenderness or lesion. VAGINA: normal appearing vagina with normal color and discharge, no lesions. CERVIX: normal appearing cervix without lesions, cervical motion tenderness absent, cervical os closed with out purulent  discharge; vaginal discharge - white, Wet prep and DNA probe for chlamydia and GC obtained.   ADNEXA: normal adnexa in size, nontender and no masses UTERUS: uterus is normal size, shape, consistency and nontender.   ED Course  Procedures (including critical care time)  Medications  sodium chloride 0.9 % bolus 1,000 mL (0 mLs Intravenous Stopped 05/30/13 0934)  morphine 4 MG/ML  injection 2 mg (2 mg Intravenous Given 05/30/13 0715)  cefTRIAXone (ROCEPHIN) injection 250 mg (250 mg Intramuscular Given 05/30/13 0941)  azithromycin (ZITHROMAX) powder 1 g (1 g Oral Given 05/30/13 0938)  morphine 4 MG/ML injection 2 mg (2 mg Intravenous Given 05/30/13 0933)  lidocaine (PF) (XYLOCAINE) 1 % injection (5 mLs  Given 05/30/13 0941)     Labs Reviewed  WET PREP, GENITAL - Abnormal; Notable for the following:    WBC, Wet Prep HPF POC MODERATE (*)    All other components within normal limits  CBC WITH DIFFERENTIAL - Abnormal; Notable for the following:    WBC 10.7 (*)    All other components within normal limits  URINALYSIS, ROUTINE W REFLEX MICROSCOPIC - Abnormal; Notable for the following:    APPearance CLOUDY (*)    Specific Gravity, Urine 1.034 (*)    Bilirubin Urine SMALL (*)    Ketones, ur 15 (*)    All other components within normal limits  GC/CHLAMYDIA PROBE AMP  COMPREHENSIVE METABOLIC PANEL  LIPASE, BLOOD  URINE MICROSCOPIC-ADD ON  POCT PREGNANCY, URINE   No results found. 1. Abdominal pain     MDM  Patient is nontoxic, nonseptic appearing, in no apparent distress.  Patient's pain and other symptoms adequately managed in emergency department.  Fluid bolus given.  Labs and vitals reviewed.  Patient does not meet the SIRS or Sepsis criteria.  On repeat exam patient does not have a surgical abdomen and there are nor peritoneal signs.  No indication of appendicitis, bowel obstruction, bowel perforation, cholecystitis, diverticulitis, PID or ectopic pregnancy.  Patient discharged home with symptomatic treatment and given strict instructions for finding a PCP for follow-up with their primary care physician.  I have also discussed reasons to return immediately to the ER.  Patient expresses understanding and agrees with plan. Patient d/w with Dr. Lavella Lemons, agrees with plan. Patient is stable at time of discharge       Jeannetta Ellis, PA-C 05/30/13 1612

## 2013-05-31 LAB — GC/CHLAMYDIA PROBE AMP: CT Probe RNA: NEGATIVE

## 2013-05-31 NOTE — ED Provider Notes (Signed)
Medical screening examination/treatment/procedure(s) were performed by non-physician practitioner and as supervising physician I was immediately available for consultation/collaboration.   Alvira Hecht, MD 05/31/13 0928 

## 2013-12-06 NOTE — L&D Delivery Note (Signed)
Delivery Note At  a viable female was delivered via SVD at 2352 (Presentation: uncertain).  APGAR:5 ,8 ; weight: pending  .   Placenta status: intact and complete @ 0033.  Cord:  with the following complications: Patent delivered off site by Caren GriffinsAshlee Scott with EMS. Placenta delivered in MAU without difficult. Minimal bleeding .  Cord pH: not done   Anesthesia:  None  Episiotomy:  None  Lacerations:  None  Suture Repair: NA Est. Blood Loss (mL):    Mom to postpartum.  Baby to NICU.  Tawnya CrookHogan, [name] [name] 10/08/2014, 12:50 AM

## 2014-03-03 ENCOUNTER — Emergency Department (HOSPITAL_COMMUNITY)
Admission: EM | Admit: 2014-03-03 | Discharge: 2014-03-03 | Disposition: A | Discharge status: Home / Self Care | Payer: Medicaid Other | Attending: Emergency Medicine | Admitting: Emergency Medicine

## 2014-03-03 ENCOUNTER — Encounter (HOSPITAL_COMMUNITY): Payer: Self-pay | Admitting: Emergency Medicine

## 2014-03-03 DIAGNOSIS — Z8659 Personal history of other mental and behavioral disorders: Secondary | ICD-10-CM | POA: Insufficient documentation

## 2014-03-03 DIAGNOSIS — F17200 Nicotine dependence, unspecified, uncomplicated: Secondary | ICD-10-CM | POA: Insufficient documentation

## 2014-03-03 DIAGNOSIS — Z862 Personal history of diseases of the blood and blood-forming organs and certain disorders involving the immune mechanism: Secondary | ICD-10-CM | POA: Insufficient documentation

## 2014-03-03 DIAGNOSIS — A5909 Other urogenital trichomoniasis: Secondary | ICD-10-CM | POA: Insufficient documentation

## 2014-03-03 DIAGNOSIS — Z3202 Encounter for pregnancy test, result negative: Secondary | ICD-10-CM | POA: Insufficient documentation

## 2014-03-03 DIAGNOSIS — Z87440 Personal history of urinary (tract) infections: Secondary | ICD-10-CM | POA: Insufficient documentation

## 2014-03-03 LAB — URINALYSIS, ROUTINE W REFLEX MICROSCOPIC
Bilirubin Urine: NEGATIVE
GLUCOSE, UA: NEGATIVE mg/dL
HGB URINE DIPSTICK: NEGATIVE
KETONES UR: NEGATIVE mg/dL
Leukocytes, UA: NEGATIVE
Nitrite: NEGATIVE
PROTEIN: NEGATIVE mg/dL
Specific Gravity, Urine: 1.016 (ref 1.005–1.030)
Urobilinogen, UA: 1 mg/dL (ref 0.0–1.0)
pH: 8.5 — ABNORMAL HIGH (ref 5.0–8.0)

## 2014-03-03 LAB — WET PREP, GENITAL
Clue Cells Wet Prep HPF POC: NONE SEEN
YEAST WET PREP: NONE SEEN

## 2014-03-03 LAB — POC URINE PREG, ED: Preg Test, Ur: NEGATIVE

## 2014-03-03 MED ORDER — METRONIDAZOLE 500 MG PO TABS
2000.0000 mg | ORAL_TABLET | Freq: Once | ORAL | Status: AC
  Administered 2014-03-03: 2000 mg via ORAL
  Filled 2014-03-03: qty 4

## 2014-03-03 MED ORDER — LIDOCAINE HCL (PF) 1 % IJ SOLN
INTRAMUSCULAR | Status: AC
  Administered 2014-03-03: 0.9 mL
  Filled 2014-03-03: qty 5

## 2014-03-03 MED ORDER — AZITHROMYCIN 250 MG PO TABS
1000.0000 mg | ORAL_TABLET | Freq: Once | ORAL | Status: AC
  Administered 2014-03-03: 1000 mg via ORAL
  Filled 2014-03-03: qty 4

## 2014-03-03 MED ORDER — CEFTRIAXONE SODIUM 250 MG IJ SOLR
250.0000 mg | Freq: Once | INTRAMUSCULAR | Status: AC
  Administered 2014-03-03: 250 mg via INTRAMUSCULAR
  Filled 2014-03-03: qty 250

## 2014-03-03 NOTE — ED Notes (Signed)
Pt given water per Lemont Fillersatyana, PA request. Pt states she will try to provide urinal sample.

## 2014-03-03 NOTE — ED Provider Notes (Signed)
CSN: [id number]     Arrival date & time 03/03/14  1258 History   First MD Initiated Contact with Patient 03/03/14 1306     Chief Complaint  Patient presents with  . Vaginal Discharge     (Consider location/radiation/quality/duration/timing/severity/associated sxs/prior Treatment) HPI [name] [name] is a 28 y.o. female who presents emergency department complaining of vaginal discharge and irritation. She states her symptoms began 5 days ago. She thought it was yeast infection and has been self treating her with Monistat vaginal suppository applications. Patient states her symptoms are not improving. She reports greenish yellow vaginal discharge, swelling and erythema to the vulva. She denies being pregnant. She is sexually active one partner. She denies abdominal pain or back pain. She states she just finished her menstrual cycle yesterday. She denies any fever, chills, malaise. No urinary symptoms. She reports a small "lesion" near her clitoris.  Past Medical History  Diagnosis Date  . Abnormal Pap smear   . Kidney infection   . UTI (lower urinary tract infection)   . Trichimoniasis   . Anemia   . Anxiety   . Depression    Past Surgical History  Procedure Laterality Date  . Induced abortion     History reviewed. No pertinent family history. History  Substance Use Topics  . Smoking status: Current Every Day Smoker -- 1.00 packs/day    Types: Cigarettes  . Smokeless tobacco: Never Used  . Alcohol Use: No     Comment: occasional    OB History   Grav Para Term Preterm Abortions TAB SAB Ect Mult Living   5 3  3 1 1    3      Review of Systems  Constitutional: Negative for fever and chills.  Respiratory: Negative for cough, chest tightness and shortness of breath.   Cardiovascular: Negative for chest pain, palpitations and leg swelling.  Gastrointestinal: Negative for nausea, vomiting, abdominal pain and diarrhea.  Genitourinary: Positive for vaginal discharge and vaginal  pain. Negative for dysuria, flank pain, vaginal bleeding and pelvic pain.  Musculoskeletal: Negative for back pain, myalgias, neck pain and neck stiffness.  Skin: Negative for rash.  Neurological: Negative for dizziness, weakness and headaches.  All other systems reviewed and are negative.      Allergies  Review of patient's allergies indicates no known allergies.  Home Medications   Current Outpatient Rx  Name  Route  Sig  Dispense  Refill  . aspirin-acetaminophen-caffeine (EXCEDRIN MIGRAINE) 250-250-65 MG per tablet   Oral   Take 2 tablets by mouth every 6 (six) hours as needed (headache).         . Multiple Vitamin (MULTIVITAMIN WITH MINERALS) TABS   Oral   Take 1 tablet by mouth daily.          BP 122/62  Pulse 65  Temp(Src) 98.1 F (36.7 C) (Oral)  Resp 20  Wt 159 lb 4.8 oz (72.258 kg)  SpO2 100% Physical Exam  Nursing note and vitals reviewed. Constitutional: She appears well-developed and well-nourished. No distress.  HENT:  Head: Normocephalic.  Eyes: Conjunctivae are normal.  Neck: Neck supple.  Cardiovascular: Normal rate, regular rhythm and normal heart sounds.   Pulmonary/Chest: Effort normal and breath sounds normal. No respiratory distress. She has no wheezes. She has no rales.  Abdominal: Soft. Bowel sounds are normal. She exhibits no distension. There is no tenderness. There is no rebound.  Genitourinary:  Normal external genitalia. Small area of induration to the left of the clitoris, mild tenderness.  No ulceration, no vescicle, no drainage. Vaginal canal with yellow discharge. Cervix erythematous. Minimal cmt. No uterine or adnexal tenderness.    Musculoskeletal: She exhibits no edema.  Neurological: She is alert.  Skin: Skin is warm and dry.  Psychiatric: She has a normal mood and affect. Her behavior is normal.    ED Course  Procedures (including critical care time) Labs Review Labs Reviewed  WET PREP, GENITAL - Abnormal; Notable for the  following:    Trich, Wet Prep MANY (*)    WBC, Wet Prep HPF POC MANY (*)    All other components within normal limits  URINALYSIS, ROUTINE W REFLEX MICROSCOPIC - Abnormal; Notable for the following:    pH 8.5 (*)    All other components within normal limits  GC/CHLAMYDIA PROBE AMP  POC URINE PREG, ED   Imaging Review No results found.   EKG Interpretation None      MDM   Final diagnoses:  Trichomonal cervicitis    Pt with vaginal discharge, irritation. CMT, no uterine or adnexal tenderness on exam. Yellow vaginal discharge present. Wet prep positive for trichomoniasis. Will cover emergency department with Flagyl 2 g by mouth, Rocephin 250 mg IM, Zithromax 1 g by mouth. Discussed results with patient. Instructed her partner to be treated as well. Cultures are pending for GC and chlamydia. No signs of PID based on exam, patient is afebrile, nontoxic appearing. Will discharge home with close followup.  Filed Vitals:   03/03/14 1303 03/03/14 1312 03/03/14 1330 03/03/14 1452  BP: 132/72 122/62 118/67 120/63  Pulse: 76 65 56 49  Temp: 98.1 F (36.7 C)     TempSrc: Oral     Resp: 20     Weight: 159 lb 4.8 oz (72.258 kg)     SpO2: 100% 100% 99% 99%       Myriam Jacobsonatyana A Analeese Andreatta, PA-C 03/05/14 0020

## 2014-03-03 NOTE — Discharge Instructions (Signed)
No intercourse for a week. Your gonorrhea and chlamydia cultures are pending, and you will be informed by phone if they return positive. Make sure your partner is treated for trichomonas as well. Follow up with your doctor for recheck.    Trichomoniasis Trichomoniasis is an infection, caused by the Trichomonas organism, that affects both women and men. In women, the outer female genitalia and the vagina are affected. In men, the penis is mainly affected, but the prostate and other reproductive organs can also be involved. Trichomoniasis is a sexually transmitted disease (STD) and is most often passed to another person through sexual contact. The majority of people who get trichomoniasis do so from a sexual encounter and are also at risk for other STDs. CAUSES   Sexual intercourse with an infected partner.  It can be present in swimming pools or hot tubs. SYMPTOMS   Abnormal gray-green frothy vaginal discharge in women.  Vaginal itching and irritation in women.  Itching and irritation of the area outside the vagina in women.  Penile discharge with or without pain in males.  Inflammation of the urethra (urethritis), causing painful urination.  Bleeding after sexual intercourse. RELATED COMPLICATIONS  Pelvic inflammatory disease.  Infection of the uterus (endometritis).  Infertility.  Tubal (ectopic) pregnancy.  It can be associated with other STDs, including gonorrhea and chlamydia, hepatitis B, and HIV. COMPLICATIONS DURING PREGNANCY  Early (premature) delivery.  Premature rupture of the membranes (PROM).  Low birth weight. DIAGNOSIS   Visualization of Trichomonas under the microscope from the vagina discharge.  Ph of the vagina greater than 4.5, tested with a test tape.  Trich Rapid Test.  Culture of the organism, but this is not usually needed.  It may be found on a Pap test.  Having a "strawberry cervix,"which means the cervix looks very red like a  strawberry. TREATMENT   You may be given medication to fight the infection. Inform your caregiver if you could be or are pregnant. Some medications used to treat the infection should not be taken during pregnancy.  Over-the-counter medications or creams to decrease itching or irritation may be recommended.  Your sexual partner will need to be treated if infected. HOME CARE INSTRUCTIONS   Take all medication prescribed by your caregiver.  Take over-the-counter medication for itching or irritation as directed by your caregiver.  Do not have sexual intercourse while you have the infection.  Do not douche or wear tampons.  Discuss your infection with your partner, as your partner may have acquired the infection from you. Or, your partner may have been the person who transmitted the infection to you.  Have your sex partner examined and treated if necessary.  Practice safe, informed, and protected sex.  See your caregiver for other STD testing. SEEK MEDICAL CARE IF:   You still have symptoms after you finish the medication.  You have an oral temperature above 102 F (38.9 C).  You develop belly (abdominal) pain.  You have pain when you urinate.  You have bleeding after sexual intercourse.  You develop a rash.  The medication makes you sick or makes you throw up (vomit). Document Released: 05/18/2001 Document Revised: 02/14/2012 Document Reviewed: 06/13/2009 [facility] Patient Information 2014 [employer] [place].

## 2014-03-03 NOTE — ED Notes (Signed)
Pt in c/o vaginal discharge, pt states she thinks that she has a yeast infection and took and OTC medication for this with no relief, also c/o irritation to area and possible abscess

## 2014-03-04 LAB — GC/CHLAMYDIA PROBE AMP
CT PROBE, AMP APTIMA: NEGATIVE
GC Probe RNA: NEGATIVE

## 2014-03-05 NOTE — ED Provider Notes (Signed)
Medical screening examination/treatment/procedure(s) were performed by non-physician practitioner and as supervising physician I was immediately available for consultation/collaboration.   EKG Interpretation None        Lorella Gomez, MD 03/05/14 0734 

## 2014-04-22 ENCOUNTER — Encounter (HOSPITAL_COMMUNITY): Payer: Self-pay

## 2014-04-22 ENCOUNTER — Inpatient Hospital Stay (HOSPITAL_COMMUNITY)
Admission: AD | Admit: 2014-04-22 | Discharge: 2014-04-22 | Disposition: A | Discharge status: Home / Self Care | Payer: Medicaid Other | Source: Ambulatory Visit | Attending: Obstetrics & Gynecology | Admitting: Obstetrics & Gynecology

## 2014-04-22 DIAGNOSIS — F17200 Nicotine dependence, unspecified, uncomplicated: Secondary | ICD-10-CM | POA: Insufficient documentation

## 2014-04-22 DIAGNOSIS — Z3201 Encounter for pregnancy test, result positive: Secondary | ICD-10-CM | POA: Insufficient documentation

## 2014-04-22 HISTORY — DX: Unspecified abnormal cytological findings in specimens from vagina: R87.629

## 2014-04-22 LAB — URINALYSIS, ROUTINE W REFLEX MICROSCOPIC
BILIRUBIN URINE: NEGATIVE
GLUCOSE, UA: NEGATIVE mg/dL
HGB URINE DIPSTICK: NEGATIVE
Ketones, ur: NEGATIVE mg/dL
Leukocytes, UA: NEGATIVE
Nitrite: NEGATIVE
PH: 7 (ref 5.0–8.0)
Protein, ur: NEGATIVE mg/dL
SPECIFIC GRAVITY, URINE: 1.02 (ref 1.005–1.030)
Urobilinogen, UA: 0.2 mg/dL (ref 0.0–1.0)

## 2014-04-22 LAB — WET PREP, GENITAL
Clue Cells Wet Prep HPF POC: NONE SEEN
TRICH WET PREP: NONE SEEN
YEAST WET PREP: NONE SEEN

## 2014-04-22 LAB — POCT PREGNANCY, URINE: Preg Test, Ur: POSITIVE — AB

## 2014-04-22 NOTE — MAU Note (Signed)
Patient states she is 3 weeks late for her period but had a negative pregnancy test at home. Has been having symptoms of pregnancy and wants tested. Has had a vaginal discharge for several days. No pain or bleeding today. Breast tender, feels tired.

## 2014-04-22 NOTE — MAU Note (Signed)
Plans to terminate; desires an u/s to see how far along she is; believes that she is less than [redacted] weeks gestation; desires STD check also;

## 2014-04-22 NOTE — MAU Note (Signed)
Patient states she has been having a vaginal discharge and wants STI testing.

## 2014-04-22 NOTE — MAU Provider Note (Signed)
History     CSN: [id number]  Arrival date and time: 04/22/14 [id number]   First Provider Initiated Contact with Patient 04/22/14 1115      Chief Complaint  Patient presents with  . Possible Pregnancy   HPI Comments: [name] [name] is a 28 y.o. [id number] who is 4174w5d and presents with a late period. She is about 3 weeks late for her period and her HPT was negative. She complains of intermittent mild lower abdominal cramping x 3 weeks. She has had some clear-white vaginal discharge that has no odor and does not itch, no blood. She has also been experiencing lightheadedness, palpitations, nausea, constipation, and breast tenderness. She denies fever, chills, CP, dyspnea, vomiting, and diarrhea. She expresses desire to have an U/S for dating so that she can terminate pregnancy, and to have a pelvic exam done for STIs. Currently the patient has no pain or bleeding.   Possible Pregnancy Associated symptoms include abdominal pain, headaches and nausea. Pertinent negatives include no chest pain, chills, fever or vomiting.     Past Medical History  Diagnosis Date  . Abnormal Pap smear   . Kidney infection   . UTI (lower urinary tract infection)   . Trichimoniasis   . Anemia   . Anxiety   . Depression   . Vaginal Pap smear, abnormal     Past Surgical History  Procedure Laterality Date  . Induced abortion      Family History  Problem Relation Age of Onset  . Cancer Mother   . Diabetes Father   . Hypertension Father     History  Substance Use Topics  . Smoking status: Current Every Day Smoker -- 0.50 packs/day    Types: Cigarettes  . Smokeless tobacco: Never Used  . Alcohol Use: No     Comment: occasional     Allergies: No Known Allergies  Prescriptions prior to admission  Medication Sig Dispense Refill  . traMADol (ULTRAM) 50 MG tablet Take 50 mg by mouth every 6 (six) hours as needed for moderate pain.       Results for orders placed during the hospital encounter of  04/22/14 (from the past 48 hour(s))  URINALYSIS, ROUTINE W REFLEX MICROSCOPIC     Status: Abnormal   Collection Time    04/22/14  9:45 AM      Result Value Ref Range   Color, Urine YELLOW  YELLOW   APPearance HAZY (*) CLEAR   Specific Gravity, Urine 1.020  1.005 - 1.030   pH 7.0  5.0 - 8.0   Glucose, UA NEGATIVE  NEGATIVE mg/dL   Hgb urine dipstick NEGATIVE  NEGATIVE   Bilirubin Urine NEGATIVE  NEGATIVE   Ketones, ur NEGATIVE  NEGATIVE mg/dL   Protein, ur NEGATIVE  NEGATIVE mg/dL   Urobilinogen, UA 0.2  0.0 - 1.0 mg/dL   Nitrite NEGATIVE  NEGATIVE   Leukocytes, UA NEGATIVE  NEGATIVE   Comment: MICROSCOPIC NOT DONE ON URINES WITH NEGATIVE PROTEIN, BLOOD, LEUKOCYTES, NITRITE, OR GLUCOSE <1000 mg/dL.  POCT PREGNANCY, URINE     Status: Abnormal   Collection Time    04/22/14  9:59 AM      Result Value Ref Range   Preg Test, Ur POSITIVE (*) NEGATIVE   Comment:            THE SENSITIVITY OF THIS     METHODOLOGY IS >24 mIU/mL  WET PREP, GENITAL     Status: Abnormal   Collection Time    04/22/14 11:40 AM  Result Value Ref Range   Yeast Wet Prep HPF POC NONE SEEN  NONE SEEN   Trich, Wet Prep NONE SEEN  NONE SEEN   Clue Cells Wet Prep HPF POC NONE SEEN  NONE SEEN   WBC, Wet Prep HPF POC FEW (*) NONE SEEN   Comment: MODERATE BACTERIA SEEN    Review of Systems  Constitutional: Negative for fever and chills.  Eyes: Negative for blurred vision.  Cardiovascular: Positive for palpitations. Negative for chest pain.  Gastrointestinal: Positive for nausea and abdominal pain. Negative for heartburn, vomiting, diarrhea and constipation.  Genitourinary: Negative for dysuria, urgency, frequency and hematuria.  Neurological: Positive for dizziness and headaches.   Physical Exam   Blood pressure 116/64, pulse 73, temperature 98.7 F (37.1 C), temperature source Oral, resp. rate 16, height 5\' 7"  (1.702 m), weight 74.027 kg (163 lb 3.2 oz), last menstrual period 02/27/2014, SpO2  99.00%.  Physical Exam  Constitutional: She appears well-developed.  HENT:  Head: Normocephalic.  Cardiovascular: Normal rate.   Respiratory: Effort normal.  GI: Soft. There is no tenderness. There is no rebound and no guarding.  Genitourinary: Cervix exhibits no motion tenderness, no discharge and no friability. Right adnexum displays no mass and no tenderness. Left adnexum displays no mass and no tenderness. No erythema, tenderness or bleeding around the vagina. Vaginal discharge (milky white) found.  Neurological: She is alert.  Skin: Skin is warm and dry.  Psychiatric: She has a normal mood and affect.    MAU Course  Procedures None  MDM Pelvic exam with GC/chlamydia and wet prep to r/o infection Patient plans to terminate pregnancy   Assessment and Plan   Assessment:  1. Encounter for pregnancy test, result positive     Plan:  Discharge home in stable condition Pregnancy verification letter provided  Discussed Birth control with the patient and she plans to contact a GYN to discuss options  [name] [name] 04/22/2014, 11:22 AM   Evaluation and management procedures were performed by the PA student under my supervision and collaboration. I have reviewed the note and chart, and I agree with the management and plan.  [name] HansenJennifer Irene Rasch, NP 04/22/2014 1:46 PM

## 2014-04-23 LAB — GC/CHLAMYDIA PROBE AMP
CT PROBE, AMP APTIMA: NEGATIVE
GC Probe RNA: NEGATIVE

## 2014-04-26 NOTE — MAU Provider Note (Signed)
Attestation of Attending Supervision of Advanced Practitioner (CNM/NP): Evaluation and management procedures were performed by the Advanced Practitioner under my supervision and collaboration. I have reviewed the Advanced Practitioner's note and chart, and I agree with the management and plan.  Sanjuanita Condrey H Kenijah Benningfield 11:18 AM   

## 2014-08-23 ENCOUNTER — Other Ambulatory Visit (HOSPITAL_COMMUNITY)
Admission: RE | Admit: 2014-08-23 | Discharge: 2014-08-23 | Disposition: A | Discharge status: Home / Self Care | Payer: Medicaid Other | Source: Ambulatory Visit | Attending: Family Medicine | Admitting: Family Medicine

## 2014-08-23 ENCOUNTER — Ambulatory Visit (INDEPENDENT_AMBULATORY_CARE_PROVIDER_SITE_OTHER): Payer: Medicaid Other | Admitting: Family Medicine

## 2014-08-23 ENCOUNTER — Encounter: Payer: Self-pay | Admitting: Family Medicine

## 2014-08-23 VITALS — BP 115/53 | HR 69 | Temp 98.3°F | Wt 168.9 lb

## 2014-08-23 DIAGNOSIS — IMO0001 Reserved for inherently not codable concepts without codable children: Secondary | ICD-10-CM

## 2014-08-23 DIAGNOSIS — N898 Other specified noninflammatory disorders of vagina: Secondary | ICD-10-CM

## 2014-08-23 DIAGNOSIS — F1910 Other psychoactive substance abuse, uncomplicated: Secondary | ICD-10-CM

## 2014-08-23 DIAGNOSIS — O99321 Drug use complicating pregnancy, first trimester: Secondary | ICD-10-CM

## 2014-08-23 DIAGNOSIS — O99340 Other mental disorders complicating pregnancy, unspecified trimester: Secondary | ICD-10-CM

## 2014-08-23 DIAGNOSIS — O0930 Supervision of pregnancy with insufficient antenatal care, unspecified trimester: Secondary | ICD-10-CM

## 2014-08-23 DIAGNOSIS — O09212 Supervision of pregnancy with history of pre-term labor, second trimester: Secondary | ICD-10-CM

## 2014-08-23 DIAGNOSIS — Z01419 Encounter for gynecological examination (general) (routine) without abnormal findings: Secondary | ICD-10-CM | POA: Diagnosis not present

## 2014-08-23 DIAGNOSIS — Z1159 Encounter for screening for other viral diseases: Secondary | ICD-10-CM

## 2014-08-23 DIAGNOSIS — Z23 Encounter for immunization: Secondary | ICD-10-CM

## 2014-08-23 DIAGNOSIS — O0932 Supervision of pregnancy with insufficient antenatal care, second trimester: Secondary | ICD-10-CM | POA: Insufficient documentation

## 2014-08-23 DIAGNOSIS — Z124 Encounter for screening for malignant neoplasm of cervix: Secondary | ICD-10-CM

## 2014-08-23 DIAGNOSIS — O09219 Supervision of pregnancy with history of pre-term labor, unspecified trimester: Secondary | ICD-10-CM

## 2014-08-23 DIAGNOSIS — O09892 Supervision of other high risk pregnancies, second trimester: Secondary | ICD-10-CM

## 2014-08-23 DIAGNOSIS — Z118 Encounter for screening for other infectious and parasitic diseases: Secondary | ICD-10-CM

## 2014-08-23 LAB — POCT URINALYSIS DIP (DEVICE)
Bilirubin Urine: NEGATIVE
Bilirubin Urine: NEGATIVE
Glucose, UA: NEGATIVE mg/dL
Glucose, UA: NEGATIVE mg/dL
Hgb urine dipstick: NEGATIVE
Hgb urine dipstick: NEGATIVE
KETONES UR: NEGATIVE mg/dL
Ketones, ur: NEGATIVE mg/dL
Nitrite: NEGATIVE
Nitrite: NEGATIVE
PH: 7 (ref 5.0–8.0)
PROTEIN: NEGATIVE mg/dL
PROTEIN: NEGATIVE mg/dL
SPECIFIC GRAVITY, URINE: 1.02 (ref 1.005–1.030)
Specific Gravity, Urine: 1.015 (ref 1.005–1.030)
UROBILINOGEN UA: 0.2 mg/dL (ref 0.0–1.0)
Urobilinogen, UA: 0.2 mg/dL (ref 0.0–1.0)
pH: 6.5 (ref 5.0–8.0)

## 2014-08-23 NOTE — Progress Notes (Signed)
Subjective:    [name] [name] is a [id number] [redacted]w[redacted]d being seen today for her first obstetrical visit.  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   The patient's LMP is 02/27/14, but had some vaginal bleeding for about 1 week after her MAU visit in May.  She does not have custody of her other children..  Patient does not intend to breast feed. Pregnancy history fully reviewed.  Patient reports green vaginal discharge.  Filed Vitals:   08/23/14 0935  BP: 115/53  Pulse: 69  Temp: 98.3 F (36.8 C)  Weight: 168 lb 14.4 oz (76.613 kg)    HISTORY: OB History  Gravida Para Term Preterm AB SAB TAB Ectopic Multiple Living  # Outcome Date GA Lbr Len/2nd Weight Sex Delivery Anes PTL Lv  5 CUR           4 PRE 04/09/12 [redacted]w[redacted]d / 00:04 3 lb 9.9 oz (1.64 kg) M SVD EPI  Y  3 TAB 2011          2 PRE 10/31/07 [redacted]w[redacted]d  4 lb 11 oz (2.126 kg) F SVD EPI Y Y  1 PRE 11/05/05 [redacted]w[redacted]d  3 lb 9 oz (1.616 kg) F SVD EPI Y Y     Comments: System Generated. Please review and update pregnancy details.     Past Medical History  Diagnosis Date  . Abnormal Pap smear   . UTI (lower urinary tract infection)   . Trichimoniasis   . Anemia   . Anxiety   . Vaginal Pap smear, abnormal   . Depression   . Kidney infection    Past Surgical History  Procedure Laterality Date  . Induced abortion     Family History  Problem Relation Age of Onset  . Cancer Mother   . Hypertension Mother   . Diabetes Father   . Hypertension Father   . Hyperlipidemia Father   . Mental illness Maternal Grandmother      Exam    Uterus:     Pelvic Exam:    Perineum: No Hemorrhoids, normal peritoneum   Vulva: normal, perineum well healed   Vagina:  normal mucosa, greenish discharge   Cervix: no bleeding following Pap and no lesions  System:     Skin:  normal coloration and turgor, no rashes    Neurologic: oriented, normal, normal mood, gait normal; reflexes normal and symmetric   Extremities: normal strength, tone, and muscle mass   HEENT PERRLA and extra ocular movement intact   Mouth/Teeth mucous membranes moist, pharynx normal without lesions   Neck supple and no masses   Cardiovascular: regular rate and rhythm, no murmurs or gallops   Respiratory:  appears well, vitals normal, no respiratory distress, acyanotic, normal RR, ear and throat exam is normal, neck free of mass or lymphadenopathy, chest clear, no wheezing, crepitations, rhonchi, normal symmetric air entry   Abdomen: soft, non-tender; bowel sounds normal; no masses,  no organomegaly   Urinary: urethral meatus normal      Assessment:    Pregnancy: [id number] Patient Active Problem List   Diagnosis Date Noted  . Late prenatal care affecting pregnancy in second trimester, antepartum 08/23/2014  . Substance abuse affecting pregnancy in first trimester, antepartum 08/23/2014        Plan:  Initial labs drawn. Prenatal vitamins. Problem list reviewed and updated.  Ultrasound discussed; fetal survey: referred to MFM for [place] and counseling..  Follow up in 3 weeks. 100% of 50 min visit spent on counseling and coordination of care.  Will apply for makena Wet prep and gc/ct obtained with PAP. Will refer to [facility] for SW consult due to incarceration and not having custody of other children.   Candelaria Celeste JEHIEL 08/23/2014

## 2014-08-23 NOTE — Progress Notes (Signed)
Pt desires flu vaccine, reports greenish discharge that comes out like a period, states is on an antibiotic that is given to her by the jail.  She is unsure of what it is for or the name of the medication.  Pt does not have custody of her 3 children,  Pt does admit to crack cocaine uses for 3 months while pregnant.

## 2014-08-23 NOTE — Patient Instructions (Signed)
Second Trimester of Pregnancy The second trimester is from week 13 through week 28, months 4 through 6. The second trimester is often a time when you feel your best. Your body has also adjusted to being pregnant, and you begin to feel better physically. Usually, morning sickness has lessened or quit completely, you may have more energy, and you may have an increase in appetite. The second trimester is also a time when the fetus is growing rapidly. At the end of the sixth month, the fetus is about 9 inches long and weighs about 1 pounds. You will likely begin to feel the baby move (quickening) between 18 and 20 weeks of the pregnancy. BODY CHANGES Your body goes through many changes during pregnancy. The changes vary from woman to woman.   Your weight will continue to increase. You will notice your lower abdomen bulging out.  You may begin to get stretch marks on your hips, abdomen, and breasts.  You may develop headaches that can be relieved by medicines approved by your health care provider.  You may urinate more often because the fetus is pressing on your bladder.  You may develop or continue to have heartburn as a result of your pregnancy.  You may develop constipation because certain hormones are causing the muscles that push waste through your intestines to slow down.  You may develop hemorrhoids or swollen, bulging veins (varicose veins).  You may have back pain because of the weight gain and pregnancy hormones relaxing your joints between the bones in your pelvis and as a result of a shift in weight and the muscles that support your balance.  Your breasts will continue to grow and be tender.  Your gums may bleed and may be sensitive to brushing and flossing.  Dark spots or blotches (chloasma, mask of pregnancy) may develop on your face. This will likely fade after the baby is born.  A dark line from your belly button to the pubic area (linea nigra) may appear. This will likely fade  after the baby is born.  You may have changes in your hair. These can include thickening of your hair, rapid growth, and changes in texture. Some women also have hair loss during or after pregnancy, or hair that feels dry or thin. Your hair will most likely return to normal after your baby is born. WHAT TO EXPECT AT YOUR PRENATAL VISITS During a routine prenatal visit:  You will be weighed to make sure you and the fetus are growing normally.  Your blood pressure will be taken.  Your abdomen will be measured to track your baby's growth.  The fetal heartbeat will be listened to.  Any test results from the previous visit will be discussed. Your health care provider may ask you:  How you are feeling.  If you are feeling the baby move.  If you have had any abnormal symptoms, such as leaking fluid, bleeding, severe headaches, or abdominal cramping.  If you have any questions. Other tests that may be performed during your second trimester include:  Blood tests that check for:  Low iron levels (anemia).  Gestational diabetes (between 24 and 28 weeks).  Rh antibodies.  Urine tests to check for infections, diabetes, or protein in the urine.  An ultrasound to confirm the proper growth and development of the baby.  An amniocentesis to check for possible genetic problems.  Fetal screens for spina bifida and Down syndrome. HOME CARE INSTRUCTIONS   Avoid all smoking, herbs, alcohol, and unprescribed   drugs. These chemicals affect the formation and growth of the baby.  Follow your health care provider's instructions regarding medicine use. There are medicines that are either safe or unsafe to take during pregnancy.  Exercise only as directed by your health care provider. Experiencing uterine cramps is a good sign to stop exercising.  Continue to eat regular, healthy meals.  Wear a good support bra for breast tenderness.  Do not use hot tubs, steam rooms, or saunas.  Wear your  seat belt at all times when driving.  Avoid raw meat, uncooked cheese, cat litter boxes, and soil used by cats. These carry germs that can cause birth defects in the baby.  Take your prenatal vitamins.  Try taking a stool softener (if your health care provider approves) if you develop constipation. Eat more high-fiber foods, such as fresh vegetables or fruit and whole grains. Drink plenty of fluids to keep your urine clear or pale yellow.  Take warm sitz baths to soothe any pain or discomfort caused by hemorrhoids. Use hemorrhoid cream if your health care provider approves.  If you develop varicose veins, wear support hose. Elevate your feet for 15 minutes, 3-4 times a day. Limit salt in your diet.  Avoid heavy lifting, wear low heel shoes, and practice good posture.  Rest with your legs elevated if you have leg cramps or low back pain.  Visit your dentist if you have not gone yet during your pregnancy. Use a soft toothbrush to brush your teeth and be gentle when you floss.  A sexual relationship may be continued unless your health care provider directs you otherwise.  Continue to go to all your prenatal visits as directed by your health care provider. SEEK MEDICAL CARE IF:   You have dizziness.  You have mild pelvic cramps, pelvic pressure, or nagging pain in the abdominal area.  You have persistent nausea, vomiting, or diarrhea.  You have a bad smelling vaginal discharge.  You have pain with urination. SEEK IMMEDIATE MEDICAL CARE IF:   You have a fever.  You are leaking fluid from your vagina.  You have spotting or bleeding from your vagina.  You have severe abdominal cramping or pain.  You have rapid weight gain or loss.  You have shortness of breath with chest pain.  You notice sudden or extreme swelling of your face, hands, ankles, feet, or legs.  You have not felt your baby move in over an hour.  You have severe headaches that do not go away with  medicine.  You have vision changes. Document Released: 11/16/2001 Document Revised: 11/27/2013 Document Reviewed: 01/23/2013 ExitCare Patient Information 2015 [employer]. This information is not intended to replace advice given to you by your health care provider. Make sure you discuss any questions you have with your health care provider.  

## 2014-08-24 LAB — OBSTETRIC PANEL
ANTIBODY SCREEN: NEGATIVE
Basophils Absolute: 0 10*3/uL (ref 0.0–0.1)
Basophils Relative: 0 % (ref 0–1)
EOS ABS: 0.1 10*3/uL (ref 0.0–0.7)
EOS PCT: 1 % (ref 0–5)
HEMATOCRIT: 27.2 % — AB (ref 36.0–46.0)
HEMOGLOBIN: 9.4 g/dL — AB (ref 12.0–15.0)
Hepatitis B Surface Ag: NEGATIVE
LYMPHS ABS: 2.5 10*3/uL (ref 0.7–4.0)
Lymphocytes Relative: 19 % (ref 12–46)
MCH: 32.8 pg (ref 26.0–34.0)
MCHC: 34.6 g/dL (ref 30.0–36.0)
MCV: 94.8 fL (ref 78.0–100.0)
MONO ABS: 1 10*3/uL (ref 0.1–1.0)
MONOS PCT: 8 % (ref 3–12)
Neutro Abs: 9.4 10*3/uL — ABNORMAL HIGH (ref 1.7–7.7)
Neutrophils Relative %: 72 % (ref 43–77)
PLATELETS: 279 10*3/uL (ref 150–400)
RBC: 2.87 MIL/uL — AB (ref 3.87–5.11)
RDW: 12.6 % (ref 11.5–15.5)
RH TYPE: POSITIVE
Rubella: 2.48 Index — ABNORMAL HIGH (ref <0.90)
WBC: 13 10*3/uL — ABNORMAL HIGH (ref 4.0–10.5)

## 2014-08-24 LAB — PRESCRIPTION MONITORING PROFILE (19 PANEL)
Amphetamine/Meth: NEGATIVE ng/mL
BUPRENORPHINE, URINE: NEGATIVE ng/mL
Barbiturate Screen, Urine: NEGATIVE ng/mL
Benzodiazepine Screen, Urine: NEGATIVE ng/mL
CANNABINOID SCRN UR: NEGATIVE ng/mL
CARISOPRODOL, URINE: NEGATIVE ng/mL
COCAINE METABOLITES: NEGATIVE ng/mL
CREATININE, URINE: 85.02 mg/dL (ref >20.0)
FENTANYL URINE: NEGATIVE ng/mL
MDMA URINE: NEGATIVE ng/mL
METHADONE SCREEN, URINE: NEGATIVE ng/mL
METHAQUALONE SCREEN (URINE): NEGATIVE ng/mL
Meperidine, Ur: NEGATIVE ng/mL
Nitrites, Initial: NEGATIVE ug/mL
OXYCODONE SCRN UR: NEGATIVE ng/mL
Opiate Screen, Urine: NEGATIVE ng/mL
PHENCYCLIDINE, UR: NEGATIVE ng/mL
Propoxyphene: NEGATIVE ng/mL
Tapentadol, urine: NEGATIVE ng/mL
Tramadol Scrn, Ur: NEGATIVE ng/mL
Zolpidem, Urine: NEGATIVE ng/mL
pH, Initial: 6.5 pH (ref 4.5–8.9)

## 2014-08-24 LAB — WET PREP, GENITAL
Clue Cells Wet Prep HPF POC: NONE SEEN
TRICH WET PREP: NONE SEEN

## 2014-08-24 LAB — CULTURE, OB URINE: Colony Count: 8000

## 2014-08-24 LAB — HIV ANTIBODY (ROUTINE TESTING W REFLEX): HIV: NONREACTIVE

## 2014-08-26 ENCOUNTER — Telehealth: Payer: Self-pay | Admitting: General Practice

## 2014-08-26 MED ORDER — FLUCONAZOLE 150 MG PO TABS: ORAL_TABLET | ORAL | Status: DC

## 2014-08-26 NOTE — Telephone Encounter (Signed)
Patient currently incarcerated. Called at contact number that was provided, no answer- left message to call us back at the clinics and ask to speak with a nurse.

## 2014-08-26 NOTE — Telephone Encounter (Signed)
Called [facility] [phone] (jail) and spoke w/Betty on medical floor. I advised her that pt requires Rx for vaginal yeast. She requested that the Rx be faxed to her @ [phone]. I confirmed the fax number and agreed to send the Rx.

## 2014-08-26 NOTE — Telephone Encounter (Signed)
Message copied by Kathee Delton on Mon Aug 26, 2014 10:42 AM ------      Message from: Adam Phenix      Created: Sat Aug 24, 2014  8:59 AM       Diflucan 150 mg for yeast ------

## 2014-08-27 ENCOUNTER — Telehealth: Payer: Self-pay

## 2014-08-27 LAB — CYTOLOGY - PAP

## 2014-08-27 NOTE — Telephone Encounter (Signed)
Received patient's 17P. Attempted to call patient. No number listed. Tried both emergency contact numbers. Mobile number not accepting calls at this time. Home number incorrect. Unable to leave message. Will send letter.

## 2014-08-27 NOTE — Telephone Encounter (Signed)
Patient in need of starting 17P injections-- medication up in cabinet. Letter written but not sent after noting patient is in [facility]. Called [facility] [phone] and left message For April Hancock, Charge nurse of medical floor stating I am calling for patient and we are in need of scheduling her an appointment to obtain a weekly injection of 17P, please call clinic so that we may get this appointment scheduled.

## 2014-08-28 ENCOUNTER — Other Ambulatory Visit: Payer: Self-pay | Admitting: Family Medicine

## 2014-08-29 NOTE — Telephone Encounter (Signed)
Attempted to call [facility] for a second time. Left message on April Hancock (Charge nurse) phone stating this is my second attempt to reach you regarding inmate [name] [name], she is in need of starting weekly appointments with [place] and we need to schedule this ASAP, please call clinic.

## 2014-08-30 NOTE — Telephone Encounter (Signed)
Patient has appointments with MFM 09/03/14 0900and 1000. Made note on appointments notes to call us and send patient to [place] for injection.

## 2014-09-02 NOTE — Telephone Encounter (Signed)
Patient was positive for Gonorrhea on her pap. She needs to be treated for this tomorrow as well.

## 2014-09-02 NOTE — Telephone Encounter (Signed)
Called MFM (patient has appointment at 1000 tomorrow 09/03/14). Asked that they send patient down to clinic after appointment for 17P and treatment for chlamydia. Marylene Land stated she will call clinicwhen they send patient down.

## 2014-09-03 ENCOUNTER — Ambulatory Visit (HOSPITAL_COMMUNITY)
Admission: RE | Admit: 2014-09-03 | Discharge: 2014-09-03 | Disposition: A | Discharge status: Home / Self Care | Payer: Medicaid Other | Source: Ambulatory Visit | Attending: Obstetrics & Gynecology | Admitting: Obstetrics & Gynecology

## 2014-09-03 ENCOUNTER — Ambulatory Visit (INDEPENDENT_AMBULATORY_CARE_PROVIDER_SITE_OTHER): Payer: Medicaid Other

## 2014-09-03 ENCOUNTER — Other Ambulatory Visit: Payer: Self-pay | Admitting: Family Medicine

## 2014-09-03 VITALS — BP 115/49 | HR 73 | Wt 175.5 lb

## 2014-09-03 VITALS — BP 115/52 | HR 72 | Temp 98.2°F

## 2014-09-03 DIAGNOSIS — O09212 Supervision of pregnancy with history of pre-term labor, second trimester: Secondary | ICD-10-CM

## 2014-09-03 DIAGNOSIS — O0932 Supervision of pregnancy with insufficient antenatal care, second trimester: Secondary | ICD-10-CM

## 2014-09-03 DIAGNOSIS — O09892 Supervision of other high risk pregnancies, second trimester: Secondary | ICD-10-CM

## 2014-09-03 DIAGNOSIS — O99321 Drug use complicating pregnancy, first trimester: Secondary | ICD-10-CM

## 2014-09-03 DIAGNOSIS — F1420 Cocaine dependence, uncomplicated: Secondary | ICD-10-CM | POA: Insufficient documentation

## 2014-09-03 DIAGNOSIS — O98219 Gonorrhea complicating pregnancy, unspecified trimester: Secondary | ICD-10-CM

## 2014-09-03 DIAGNOSIS — O99320 Drug use complicating pregnancy, unspecified trimester: Secondary | ICD-10-CM | POA: Diagnosis present

## 2014-09-03 DIAGNOSIS — F1920 Other psychoactive substance dependence, uncomplicated: Secondary | ICD-10-CM | POA: Diagnosis not present

## 2014-09-03 DIAGNOSIS — Z87891 Personal history of nicotine dependence: Secondary | ICD-10-CM | POA: Insufficient documentation

## 2014-09-03 DIAGNOSIS — O98212 Gonorrhea complicating pregnancy, second trimester: Secondary | ICD-10-CM

## 2014-09-03 MED ORDER — PANTOPRAZOLE SODIUM 20 MG PO TBEC
20.0000 mg | DELAYED_RELEASE_TABLET | Freq: Every day | ORAL | Status: DC
Start: 2014-09-03 — End: 2018-01-13

## 2014-09-03 MED ORDER — FLUCONAZOLE 150 MG PO TABS: ORAL_TABLET | ORAL | Status: DC

## 2014-09-03 MED ORDER — CEFTRIAXONE SODIUM 1 G IJ SOLR
250.0000 mg | Freq: Once | INTRAMUSCULAR | Status: AC
  Administered 2014-09-03: 250 mg via INTRAMUSCULAR

## 2014-09-03 MED ORDER — HYDROXYPROGESTERONE CAPROATE 250 MG/ML IM OIL
250.0000 mg | TOPICAL_OIL | Freq: Once | INTRAMUSCULAR | Status: AC
  Administered 2014-09-03: 250 mg via INTRAMUSCULAR

## 2014-09-03 MED ORDER — AZITHROMYCIN 250 MG PO TABS
1000.0000 mg | ORAL_TABLET | Freq: Once | ORAL | Status: AC
  Administered 2014-09-03: 1000 mg via ORAL

## 2014-09-03 NOTE — Progress Notes (Signed)
MATERNAL FETAL MEDICINE CONSULT  Patient Name: [name] [name] Medical Record Number:  [id number] Date of Birth: 02-22-1986 Requesting Physician Name:  Tereso Newcomer, MD Date of Service: 09/03/2014  Chief Complaint Drug use in pregnancy  History of Present Illness [name] [name] was seen today secondary to drug use in pregnancy at the request of Tereso Newcomer, MD.  The patient is a 28 y.o. [id number],OZ [redacted]w[redacted]d with an EDD of 12/04/2014, by Last Menstrual Period dating method.  She suffers from cocaine addiction and had a relapse beginning several months ago trigger by her mother's death.  Prior to that she had been abstinent for an extended periods of time and was able to regain custody of one of her children.  She is currently incarcerated due to her drug use.  She was using crack cocaine from the beginning of pregnancy until approximately 24 weeks.  She is receiving substance abuse treatment while incarcerated.  Review of Systems Pertinent items are noted in HPI.  Patient History OB History  Gravida Para Term Preterm AB SAB TAB Ectopic Multiple Living  5 3  3 1  1   3     # Outcome Date GA Lbr Len/2nd Weight Sex Delivery Anes PTL Lv  5 CUR           4 PRE 04/09/12 [redacted]w[redacted]d / 00:04 3 lb 9.9 oz (1.64 kg) M SVD EPI  Y  3 TAB 2011          2 PRE 10/31/07 [redacted]w[redacted]d  4 lb 11 oz (2.126 kg) F SVD EPI Y Y  1 PRE 11/05/05 [redacted]w[redacted]d  3 lb 9 oz (1.616 kg) F SVD EPI Y Y     Comments: System Generated. Please review and update pregnancy details.      Past Medical History  Diagnosis Date  . Abnormal Pap smear   . UTI (lower urinary tract infection)   . Trichimoniasis   . Anemia   . Anxiety   . Vaginal Pap smear, abnormal   . Depression   . Kidney infection     Past Surgical History  Procedure Laterality Date  . Induced abortion      History   Social History  . Marital Status: Single    Spouse Name: N/A    Number of Children: N/A  . Years of Education: N/A   Social History Main Topics   . Smoking status: Former Smoker -- 0.50 packs/day    Types: Cigarettes  . Smokeless tobacco: Never Used  . Alcohol Use: Not on file     Comment: occasional   . Drug Use: Yes    Special: Marijuana, "Crack" cocaine     Comment: former use of cocaine and marijuana   . Sexual Activity: Yes    Birth Control/ Protection: Condom, None   Other Topics Concern  . Not on file   Social History Narrative  . No narrative on file    Family History  Problem Relation Age of Onset  . Cancer Mother   . Hypertension Mother   . Diabetes Father   . Hypertension Father   . Hyperlipidemia Father   . Mental illness Maternal Grandmother    In addition, the patient has no family history of mental retardation, birth defects, or genetic diseases.  Physical Examination Vitals - BP 115/49, Pulse 73, Weight 175 lbs. General appearance - alert, well appearing, and in no distress  Assessment and Recommendations 1.  Cocaine use in pregnancy.  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  Although cocaine use is not associated with any specific types of congenital anomalies, the risk of all anomalies is increased with first trimester cocaine use presumably due to vascular insults to the placenta an fetus as a result of the vasoconstriction caused by cocaine.  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  Thus, it does not appear that [name] first trimester drug use has lead to any congenital anomalies.  Obviously it is important for her to maintain sobriety throughout the remainder of pregnancy.  She is already receiving substance abuse counseling and reports that she will enter a rehab program once released from jail in 42 days.  I stressed the need to continue substance abuse treatment after release, as continued use of  cocaine during pregnancy carries a risk of preterm birth, placental abruption, and intrauterine fetal demise.  Given her late entry into prenatal care on the drug abuse I recommend performing growth ultrasounds approximately every 4 weeks.  If fetal growth restriction develops I recommend starting twice weekly fetal testing and proceed with delivery at 37 weeks.  If fetal growth restriction does not develop fetal testing is not indicated and she should be delivered no earlier than 39 weeks unless obstetrically indicated. 2.  History of prior preterm birth x3.  From [name]'s recounting of her prior pregnancies it is difficult to determine if the preterm labor was triggered by cocaine use or whether they were spontaneous preterm births.  However, given the uncertainty I feel that treatment with weekly 17-OH progesterone would be beneficial.  She will receive her [facility] dose in [facility] later this morning.  I spent 30 minutes with [name] today of which 50% was face-to-face counseling.  [name].  Please do not hesitate to contact us with questions.   Rema FendtNITSCHE,Kanita Delage, MD

## 2014-09-03 NOTE — ED Notes (Signed)
Pt states she continues to have a green discharge.

## 2014-09-03 NOTE — Progress Notes (Addendum)
Patient here today for 17P injection and treatment of GC. 1ml 17P injected into LUO quadrant of buttocks. Patient tolerated well. 1gm Zithromax administered PO. Patient tolerated well. 250mg  Rocephin mixed with 1ml 1%Lidocaine administered into RUO quadrant buttocks. Patient tolerated well. Patient c/o of thick vaginal discharge-- treatment sent to pharmacy for yeast infection after last visit, however, she has been in jail and unable to get RX. RX for Diflucan printed along with RX for Protonix for heartburn and given to security guard to give to jail nurse. As[facility] for [facility]-- was given 3 direct lines [phone]). Patient to return in 1 week for next injection of 17P.   [facility]

## 2014-09-05 ENCOUNTER — Other Ambulatory Visit: Payer: Self-pay

## 2014-09-12 ENCOUNTER — Encounter: Payer: Medicaid Other | Admitting: Obstetrics & Gynecology

## 2014-09-19 ENCOUNTER — Ambulatory Visit (INDEPENDENT_AMBULATORY_CARE_PROVIDER_SITE_OTHER): Payer: Medicaid Other | Admitting: Family

## 2014-09-19 VITALS — BP 112/47 | HR 71 | Temp 98.6°F | Wt 177.9 lb

## 2014-09-19 DIAGNOSIS — O09892 Supervision of other high risk pregnancies, second trimester: Secondary | ICD-10-CM

## 2014-09-19 DIAGNOSIS — O0932 Supervision of pregnancy with insufficient antenatal care, second trimester: Secondary | ICD-10-CM

## 2014-09-19 DIAGNOSIS — O09893 Supervision of other high risk pregnancies, third trimester: Secondary | ICD-10-CM

## 2014-09-19 DIAGNOSIS — F1910 Other psychoactive substance abuse, uncomplicated: Secondary | ICD-10-CM

## 2014-09-19 DIAGNOSIS — O99321 Drug use complicating pregnancy, first trimester: Secondary | ICD-10-CM

## 2014-09-19 DIAGNOSIS — O09213 Supervision of pregnancy with history of pre-term labor, third trimester: Secondary | ICD-10-CM

## 2014-09-19 DIAGNOSIS — Z23 Encounter for immunization: Secondary | ICD-10-CM

## 2014-09-19 DIAGNOSIS — O09212 Supervision of pregnancy with history of pre-term labor, second trimester: Secondary | ICD-10-CM

## 2014-09-19 DIAGNOSIS — Z3493 Encounter for supervision of normal pregnancy, unspecified, third trimester: Secondary | ICD-10-CM

## 2014-09-19 LAB — POCT URINALYSIS DIP (DEVICE)
BILIRUBIN URINE: NEGATIVE
Glucose, UA: NEGATIVE mg/dL
Ketones, ur: NEGATIVE mg/dL
NITRITE: NEGATIVE
Protein, ur: NEGATIVE mg/dL
Specific Gravity, Urine: 1.01 (ref 1.005–1.030)
Urobilinogen, UA: 0.2 mg/dL (ref 0.0–1.0)
pH: 6 (ref 5.0–8.0)

## 2014-09-19 LAB — CBC
HCT: 25.7 % — ABNORMAL LOW (ref 36.0–46.0)
Hemoglobin: 8.9 g/dL — ABNORMAL LOW (ref 12.0–15.0)
MCH: 32.5 pg (ref 26.0–34.0)
MCHC: 34.6 g/dL (ref 30.0–36.0)
MCV: 93.8 fL (ref 78.0–100.0)
PLATELETS: 256 10*3/uL (ref 150–400)
RBC: 2.74 MIL/uL — ABNORMAL LOW (ref 3.87–5.11)
RDW: 13.5 % (ref 11.5–15.5)
WBC: 13.7 10*3/uL — AB (ref 4.0–10.5)

## 2014-09-19 MED ORDER — TETANUS-DIPHTH-ACELL PERTUSSIS 5-2.5-18.5 LF-MCG/0.5 IM SUSP
0.5000 mL | Freq: Once | INTRAMUSCULAR | Status: AC
Start: 2014-09-19 — End: 2014-09-19
  Administered 2014-09-19: 0.5 mL via INTRAMUSCULAR

## 2014-09-19 MED ORDER — HYDROXYPROGESTERONE CAPROATE 250 MG/ML IM OIL
250.0000 mg | TOPICAL_OIL | INTRAMUSCULAR | Status: AC
  Administered 2014-09-19 – 2014-09-26 (×2): 250 mg via INTRAMUSCULAR

## 2014-09-19 NOTE — Progress Notes (Signed)
17 P injection:  Patient has not been scheduled for several weeks 28 wk labs Tdap/Flu vaccine today: pt denied information sheet

## 2014-09-19 NOTE — Progress Notes (Signed)
Pt denies any questions or concerns.  Missed echo exam (per conversation, pt unaware it was scheduled). Plan to reschedule appt - 10/10/14.  17-p needed weekly.  Missed last 17-p injection due to not having documented appt at discharge.  Correction officer given future dates for 17-p and follow-up growth ultrasound.  Third trimester labs today.

## 2014-09-19 NOTE — Progress Notes (Signed)
R/S Fetal Echo with [facility] with Dr. Mayer Camelatum 10/10/14 @ 8a.  U/S with Radiology 10/03/14 @ 945a.

## 2014-09-19 NOTE — Progress Notes (Signed)
Nutrition note: 1st consult Pt is incarcerated. Pt has gained 22.9# @ 2554w1d, which is wnl. Pt reports eating 3 meals & 1 snack/d. Pt is taking a PNV. Pt's iron was low 08/23/14. Pt received verbal & written education on general nutrition during pregnancy. Encouraged iron rich foods with vitamin C source. Discussed wt gain goals of 25-35# or 1#/wk. Pt agrees to continue to take her PNV & try to eat iron rich foods as able. Pt does not have WIC but plans to apply when she's out in ~25 days (per client). F/u as needed Blondell RevealLaura Chazz Philson, MS, RD, LDN, [employer]

## 2014-09-20 LAB — GLUCOSE TOLERANCE, 1 HOUR (50G) W/O FASTING: GLUCOSE 1 HOUR GTT: 85 mg/dL (ref 70–140)

## 2014-09-20 LAB — RPR

## 2014-09-20 LAB — HIV ANTIBODY (ROUTINE TESTING W REFLEX): HIV 1&2 Ab, 4th Generation: NONREACTIVE

## 2014-09-24 ENCOUNTER — Encounter: Payer: Self-pay | Admitting: Family

## 2014-09-26 ENCOUNTER — Inpatient Hospital Stay (HOSPITAL_COMMUNITY)
Admission: AD | Admit: 2014-09-26 | Discharge: 2014-09-26 | Disposition: A | Discharge status: Home / Self Care | Payer: Medicaid Other | Source: Ambulatory Visit | Attending: Obstetrics and Gynecology | Admitting: Obstetrics and Gynecology

## 2014-09-26 ENCOUNTER — Ambulatory Visit (INDEPENDENT_AMBULATORY_CARE_PROVIDER_SITE_OTHER): Payer: Medicaid Other | Admitting: *Deleted

## 2014-09-26 ENCOUNTER — Other Ambulatory Visit: Payer: Self-pay | Admitting: Family Medicine

## 2014-09-26 ENCOUNTER — Ambulatory Visit (INDEPENDENT_AMBULATORY_CARE_PROVIDER_SITE_OTHER): Payer: Medicaid Other | Admitting: Family Medicine

## 2014-09-26 ENCOUNTER — Encounter (HOSPITAL_COMMUNITY): Payer: Self-pay | Admitting: *Deleted

## 2014-09-26 VITALS — BP 110/55 | HR 70 | Temp 97.4°F | Wt 179.6 lb

## 2014-09-26 VITALS — BP 110/55 | HR 70 | Temp 97.4°F

## 2014-09-26 DIAGNOSIS — Z8751 Personal history of pre-term labor: Secondary | ICD-10-CM | POA: Insufficient documentation

## 2014-09-26 DIAGNOSIS — O09213 Supervision of pregnancy with history of pre-term labor, third trimester: Secondary | ICD-10-CM

## 2014-09-26 DIAGNOSIS — O09212 Supervision of pregnancy with history of pre-term labor, second trimester: Secondary | ICD-10-CM

## 2014-09-26 DIAGNOSIS — O2343 Unspecified infection of urinary tract in pregnancy, third trimester: Secondary | ICD-10-CM

## 2014-09-26 DIAGNOSIS — Z3A30 30 weeks gestation of pregnancy: Secondary | ICD-10-CM | POA: Diagnosis not present

## 2014-09-26 DIAGNOSIS — O4703 False labor before 37 completed weeks of gestation, third trimester: Secondary | ICD-10-CM

## 2014-09-26 DIAGNOSIS — O09892 Supervision of other high risk pregnancies, second trimester: Secondary | ICD-10-CM

## 2014-09-26 DIAGNOSIS — O0932 Supervision of pregnancy with insufficient antenatal care, second trimester: Secondary | ICD-10-CM

## 2014-09-26 DIAGNOSIS — O479 False labor, unspecified: Secondary | ICD-10-CM

## 2014-09-26 DIAGNOSIS — O0933 Supervision of pregnancy with insufficient antenatal care, third trimester: Secondary | ICD-10-CM

## 2014-09-26 LAB — URINALYSIS, ROUTINE W REFLEX MICROSCOPIC
BILIRUBIN URINE: NEGATIVE
Glucose, UA: NEGATIVE mg/dL
HGB URINE DIPSTICK: NEGATIVE
Ketones, ur: NEGATIVE mg/dL
Nitrite: NEGATIVE
Protein, ur: NEGATIVE mg/dL
Specific Gravity, Urine: <1.005 — ABNORMAL LOW (ref 1.005–1.030)
UROBILINOGEN UA: 0.2 mg/dL (ref 0.0–1.0)
pH: 6 (ref 5.0–8.0)

## 2014-09-26 LAB — POCT URINALYSIS DIP (DEVICE)
Bilirubin Urine: NEGATIVE
Glucose, UA: NEGATIVE mg/dL
Ketones, ur: NEGATIVE mg/dL
Nitrite: NEGATIVE
PH: 6 (ref 5.0–8.0)
Protein, ur: NEGATIVE mg/dL
Specific Gravity, Urine: 1.02 (ref 1.005–1.030)
UROBILINOGEN UA: 0.2 mg/dL (ref 0.0–1.0)

## 2014-09-26 LAB — URINE MICROSCOPIC-ADD ON

## 2014-09-26 MED ORDER — NITROFURANTOIN MONOHYD MACRO 100 MG PO CAPS: 100.0000 mg | ORAL_CAPSULE | Freq: Two times a day (BID) | ORAL | Status: DC

## 2014-09-26 MED ORDER — BETAMETHASONE SOD PHOS & ACET 6 (3-3) MG/ML IJ SUSP
12.0000 mg | Freq: Once | INTRAMUSCULAR | Status: AC
  Administered 2014-09-26: 12 mg via INTRAMUSCULAR
  Filled 2014-09-26: qty 2

## 2014-09-26 NOTE — Progress Notes (Signed)
Having some ctxs.  Having several an hour.  Mostly tightening.  Cervix dilated to about 1.5cm  Will send to MAU for eval.  GC/CT obtained here.

## 2014-09-26 NOTE — Progress Notes (Signed)
Here for 17P injection.  C/o feeling baby balling up often and tighness in abdomen that is very painful at times- not sure if is contractions. With history of 3 preterm deliveries changed to ob visit.  Patient to MAU for evaluation. Report called by Dr. Adrian BlackwaterStinson

## 2014-09-26 NOTE — Progress Notes (Signed)
Here for shot- but c/o feeling baby ball up and tightness , possible contractions- see ob follow up notes. Also c/o knot and soreness x 4 days in left hip where she got her shot last week.  Area observed today, no redness or rash , but small knot felt.  Will continue with shots- will give in right hip today and advised patient to avoids shots in knot area and have provider evaluate today.

## 2014-09-26 NOTE — MAU Provider Note (Signed)
Attestation of Attending Supervision of Advanced Practitioner (CNM/NP): Evaluation and management procedures were performed by the Advanced Practitioner under my supervision and collaboration.  I have reviewed the Advanced Practitioner's note and chart, and I agree with the management and plan.  Meleane Selinger 09/26/2014 2:25 PM

## 2014-09-26 NOTE — Discharge Instructions (Signed)

## 2014-09-26 NOTE — MAU Provider Note (Signed)
First Provider Initiated Contact with Patient 09/26/2014 at 1130.  Patient is 28 y.o. [id number] 7356w1d here with complaints of contractions, sent here from clinic due to hx of preterm labors and cervical dilation. Contractions are irregular, mild.  +FM, denies LOF, VB, vaginal discharge.  NST: Reactive. Moderate variability. 15x15 accelerations. No decelerations.    No change of cervix on exam.   Assessment/Plan: Patient is 28 y.o. [id number] 5656w1d.  #Gestation of 1356w1d --Does not appear to be in active labor. --Return tomorrow for f/u BMZ injection --Return if contractions become more regular, stronger. --Continue prenatal care as planned.  40 second decel noted when pt sitting prior to discharge. Will monitor 30 more min. D/C home if reactive.   Reactive tracing.  I was present for the exam and agree with above.  SteinauerVirginia Ferguson Gertner, CNM 09/26/2014 2:16 PM

## 2014-09-27 ENCOUNTER — Ambulatory Visit: Payer: Medicaid Other

## 2014-09-27 ENCOUNTER — Inpatient Hospital Stay (HOSPITAL_COMMUNITY)
Admission: AD | Admit: 2014-09-27 | Discharge: 2014-09-27 | Disposition: A | Discharge status: Home / Self Care | Payer: Medicaid Other | Source: Ambulatory Visit | Attending: Obstetrics & Gynecology | Admitting: Obstetrics & Gynecology

## 2014-09-27 DIAGNOSIS — Z3A30 30 weeks gestation of pregnancy: Secondary | ICD-10-CM | POA: Insufficient documentation

## 2014-09-27 LAB — GC/CHLAMYDIA PROBE AMP
CT Probe RNA: NEGATIVE
GC Probe RNA: NEGATIVE

## 2014-09-27 LAB — CULTURE, OB URINE
COLONY COUNT: NO GROWTH
CULTURE: NO GROWTH
Special Requests: NORMAL

## 2014-09-27 MED ORDER — BETAMETHASONE SOD PHOS & ACET 6 (3-3) MG/ML IJ SUSP
12.0000 mg | Freq: Once | INTRAMUSCULAR | Status: AC
  Administered 2014-09-27: 12 mg via INTRAMUSCULAR
  Filled 2014-09-27: qty 2

## 2014-09-27 NOTE — MAU Note (Signed)
Here for 2nd dose of betamethasone 

## 2014-09-30 ENCOUNTER — Encounter: Payer: Self-pay | Admitting: *Deleted

## 2014-10-03 ENCOUNTER — Ambulatory Visit: Payer: Medicaid Other

## 2014-10-03 ENCOUNTER — Ambulatory Visit (HOSPITAL_COMMUNITY): Admission: RE | Admit: 2014-10-03 | Payer: Medicaid Other | Source: Ambulatory Visit

## 2014-10-04 ENCOUNTER — Telehealth: Payer: Self-pay | Admitting: Obstetrics & Gynecology

## 2014-10-04 NOTE — Telephone Encounter (Signed)
[facility] called to inform office that patient was released 09/27/2014 and did not have contact number for patient.

## 2014-10-07 ENCOUNTER — Encounter (HOSPITAL_COMMUNITY): Payer: Self-pay | Admitting: *Deleted

## 2014-10-08 ENCOUNTER — Encounter (HOSPITAL_COMMUNITY): Payer: Self-pay | Admitting: *Deleted

## 2014-10-08 ENCOUNTER — Inpatient Hospital Stay (HOSPITAL_COMMUNITY)
Admission: AD | Admit: 2014-10-08 | Discharge: 2014-10-09 | DRG: 775 | Disposition: A | Discharge status: Home / Self Care | Payer: Medicaid Other | Source: Ambulatory Visit | Attending: Obstetrics & Gynecology | Admitting: Obstetrics & Gynecology

## 2014-10-08 DIAGNOSIS — O99325 Drug use complicating the puerperium: Secondary | ICD-10-CM | POA: Diagnosis not present

## 2014-10-08 DIAGNOSIS — F1410 Cocaine abuse, uncomplicated: Secondary | ICD-10-CM | POA: Diagnosis present

## 2014-10-08 DIAGNOSIS — F1210 Cannabis abuse, uncomplicated: Secondary | ICD-10-CM | POA: Diagnosis present

## 2014-10-08 DIAGNOSIS — Z3A31 31 weeks gestation of pregnancy: Secondary | ICD-10-CM | POA: Diagnosis present

## 2014-10-08 DIAGNOSIS — O99324 Drug use complicating childbirth: Secondary | ICD-10-CM

## 2014-10-08 LAB — CBC
HEMATOCRIT: 25.3 % — AB (ref 36.0–46.0)
HEMOGLOBIN: 9 g/dL — AB (ref 12.0–15.0)
MCH: 33.6 pg (ref 26.0–34.0)
MCHC: 35.6 g/dL (ref 30.0–36.0)
MCV: 94.4 fL (ref 78.0–100.0)
Platelets: 202 10*3/uL (ref 150–400)
RBC: 2.68 MIL/uL — ABNORMAL LOW (ref 3.87–5.11)
RDW: 13.3 % (ref 11.5–15.5)
WBC: 36.7 10*3/uL — ABNORMAL HIGH (ref 4.0–10.5)

## 2014-10-08 LAB — RAPID URINE DRUG SCREEN, HOSP PERFORMED
AMPHETAMINES: NOT DETECTED
Barbiturates: NOT DETECTED
Benzodiazepines: NOT DETECTED
COCAINE: POSITIVE — AB
OPIATES: NOT DETECTED
Tetrahydrocannabinol: POSITIVE — AB

## 2014-10-08 LAB — HIV ANTIBODY (ROUTINE TESTING W REFLEX): HIV: NONREACTIVE

## 2014-10-08 LAB — RPR

## 2014-10-08 MED ORDER — IBUPROFEN 600 MG PO TABS
600.0000 mg | ORAL_TABLET | Freq: Four times a day (QID) | ORAL | Status: DC
  Administered 2014-10-08 – 2014-10-09 (×5): 600 mg via ORAL
  Filled 2014-10-08 (×6): qty 1

## 2014-10-08 MED ORDER — ONDANSETRON HCL 4 MG/2ML IJ SOLN: 4.0000 mg | INTRAMUSCULAR | Status: DC | PRN

## 2014-10-08 MED ORDER — ZOLPIDEM TARTRATE 5 MG PO TABS: 5.0000 mg | ORAL_TABLET | Freq: Every evening | ORAL | Status: DC | PRN

## 2014-10-08 MED ORDER — SIMETHICONE 80 MG PO CHEW: 80.0000 mg | CHEWABLE_TABLET | ORAL | Status: DC | PRN

## 2014-10-08 MED ORDER — TETANUS-DIPHTH-ACELL PERTUSSIS 5-2.5-18.5 LF-MCG/0.5 IM SUSP
0.5000 mL | Freq: Once | INTRAMUSCULAR | Status: DC
Start: 2014-10-09 — End: 2014-10-09

## 2014-10-08 MED ORDER — LANOLIN HYDROUS EX OINT: TOPICAL_OINTMENT | CUTANEOUS | Status: DC | PRN

## 2014-10-08 MED ORDER — KETOROLAC TROMETHAMINE 30 MG/ML IJ SOLN: 30.0000 mg | Freq: Four times a day (QID) | INTRAMUSCULAR | Status: DC | PRN

## 2014-10-08 MED ORDER — PRENATAL MULTIVITAMIN CH
1.0000 | ORAL_TABLET | Freq: Every day | ORAL | Status: DC
  Administered 2014-10-08: 1 via ORAL
  Filled 2014-10-08 (×2): qty 1

## 2014-10-08 MED ORDER — DIPHENHYDRAMINE HCL 25 MG PO CAPS: 25.0000 mg | ORAL_CAPSULE | Freq: Four times a day (QID) | ORAL | Status: DC | PRN

## 2014-10-08 MED ORDER — OXYCODONE-ACETAMINOPHEN 5-325 MG PO TABS: 1.0000 | ORAL_TABLET | ORAL | Status: DC | PRN

## 2014-10-08 MED ORDER — SENNOSIDES-DOCUSATE SODIUM 8.6-50 MG PO TABS
2.0000 | ORAL_TABLET | ORAL | Status: DC
  Administered 2014-10-08: 2 via ORAL
  Filled 2014-10-08: qty 2

## 2014-10-08 MED ORDER — ONDANSETRON HCL 4 MG PO TABS: 4.0000 mg | ORAL_TABLET | ORAL | Status: DC | PRN

## 2014-10-08 MED ORDER — OXYCODONE-ACETAMINOPHEN 5-325 MG PO TABS: 2.0000 | ORAL_TABLET | ORAL | Status: DC | PRN

## 2014-10-08 MED ORDER — DIBUCAINE 1 % RE OINT: 1.0000 "application " | TOPICAL_OINTMENT | RECTAL | Status: DC | PRN

## 2014-10-08 MED ORDER — ACETAMINOPHEN 325 MG PO TABS
650.0000 mg | ORAL_TABLET | Freq: Four times a day (QID) | ORAL | Status: DC | PRN
  Administered 2014-10-08: 650 mg via ORAL
  Filled 2014-10-08 (×2): qty 2

## 2014-10-08 MED ORDER — BENZOCAINE-MENTHOL 20-0.5 % EX AERO: 1.0000 "application " | INHALATION_SPRAY | CUTANEOUS | Status: DC | PRN

## 2014-10-08 MED ORDER — WITCH HAZEL-GLYCERIN EX PADS: 1.0000 "application " | MEDICATED_PAD | CUTANEOUS | Status: DC | PRN

## 2014-10-08 NOTE — Plan of Care (Signed)
Problem: Phase I Progression Outcomes Goal: Pain controlled with appropriate interventions Outcome: Completed/Met Date Met:  10/08/14 Goal: VS, stable, temp < 100.4 degrees F Outcome: Not Applicable Date Met:  20/23/34  Problem: Discharge Progression Outcomes Goal: Remove staples per MD order Outcome: Not Applicable Date Met:  35/68/61

## 2014-10-08 NOTE — Care Management Note (Addendum)
    Page 1 of 1   10/08/2014     9:52:36 AM CARE MANAGEMENT NOTE 10/08/2014  Patient:  [name] [name] [name],[name] [name] [name]   Account Number:  [id number]  Date Initiated:  10/08/2014  Documentation initiated by:  [name] [name],[name]  Subjective/Objective Assessment:   28 year old admitted 10/08/14 following SVD at home.     Action/Plan:   D/C when medically stable   Anticipated DC Date:  10/08/2014   Anticipated DC Plan:  HOME/SELF CARE  In-house referral  Clinical Social Worker      DC Planning Services  CM consult                Status of service:  Completed, signed off  Discharge Disposition:  HOME/SELF CARE  Per UR Regulation:  Reviewed for med. necessity/level of care/duration of stay  Comments:  11/3/15Kathi [name], [name] [name] [name] RNC-MNN, [phone]. CM received referral for living arrangements, drug and alcohol abuse. This is a SW consult.  SW consult has already been made. CM signing off.

## 2014-10-08 NOTE — H&P (Signed)
[name] [name] is a 28 y.o. [id number]  female at 5362w6d presenting after a SVD at home. She states that she had been using crack cocaine all day. She had sudden onset of pain after leaving her motel room. [facility] Caren GriffinsAshlee Scott, EMS provider.   History OB History    Gravida Para Term Preterm AB TAB SAB Ectopic Multiple Living   5 3  3 1 1    3      Past Medical History  Diagnosis Date  . Abnormal Pap smear   . UTI (lower urinary tract infection)   . Trichimoniasis   . Anemia   . Anxiety   . Vaginal Pap smear, abnormal   . Depression   . Kidney infection    Past Surgical History  Procedure Laterality Date  . Induced abortion     Family History: family history includes Cancer in her mother; Diabetes in her father; Hyperlipidemia in her father; Hypertension in her father and mother; Mental illness in her maternal grandmother. Social History:  reports that she has quit smoking. Her smoking use included Cigarettes. She smoked 0.50 packs per day. She has never used smokeless tobacco. She reports that she uses illicit drugs (Marijuana and "Crack" cocaine). Her alcohol history is not on file.   Prenatal Transfer Tool  Maternal Diabetes: No Genetic Screening: Declined Maternal Ultrasounds/Referrals: Normal Fetal Ultrasounds or other Referrals:  Other:  Abnormality of the RVOT. Had fetal echo.  Maternal Substance Abuse:  Yes:  Type: Cocaine Significant Maternal Medications:  None Significant Maternal Lab Results:  None Other Comments:  None  ROS    Last menstrual period 02/27/2014. Exam Physical Exam  Nursing note and vitals reviewed. Constitutional: She is oriented to person, place, and time. She appears well-developed and well-nourished. No distress.  Cardiovascular: Normal rate.   Respiratory: Effort normal.  GI: Soft. There is no tenderness.  Genitourinary:  Placenta delivered in MAU without difficulty, minimal bleeding.   Perineum intact.    Neurological: She is alert and oriented to person, place, and time.  Skin: Skin is warm and dry.  Psychiatric: She has a normal mood and affect.    Prenatal labs: ABO, Rh: A/POS/-- (09/18 1055) Antibody: NEG (09/18 1055) Rubella: 2.48 (09/18 1055) RPR: NON REAC (10/15 1020)  HBsAg: NEGATIVE (09/18 1055)  HIV: NONREACTIVE (10/15 1020)  GBS:     Assessment/Plan: Arrival after SVD outside of the facility Drug abuse Admit to PP Baby to NICU Needs SW consult, drug screen   [name] [name], [name] [name] 10/08/2014, 12:45 AM

## 2014-10-08 NOTE — Progress Notes (Signed)
UR completed 

## 2014-10-08 NOTE — MAU Note (Signed)
Urine specimen collectedwith straight cathetar. [facility]

## 2014-10-08 NOTE — Plan of Care (Signed)
Problem: Phase I Progression Outcomes Goal: Voiding adequately Outcome: Completed/Met Date Met:  10/08/14 Goal: OOB as tolerated unless otherwise ordered Outcome: Completed/Met Date Met:  10/08/14 Goal: IS, TCDB as ordered Outcome: Not Applicable Date Met:  27/74/12  Problem: Phase II Progression Outcomes Goal: Pain controlled on oral analgesia Outcome: Progressing Goal: Progress activity as tolerated unless otherwise ordered Outcome: Progressing Goal: Incision intact & without signs/symptoms of infection Outcome: Not Applicable Date Met:  87/86/76 Goal: Tolerating diet Outcome: Completed/Met Date Met:  10/08/14  Problem: Discharge Progression Outcomes Goal: Tolerating diet Outcome: Completed/Met Date Met:  10/08/14 Goal: MMR given as ordered Outcome: Not Applicable Date Met:  72/09/47

## 2014-10-08 NOTE — MAU Note (Signed)
Received pt via EMS after delivering baby in parking lot of [employer]. Pt under influence of drugs and alcohol and unable to answer all questions asked.

## 2014-10-08 NOTE — Plan of Care (Signed)
Problem: Phase I Progression Outcomes Goal: Initial discharge plan identified Outcome: Completed/Met Date Met:  10/08/14     

## 2014-10-08 NOTE — Progress Notes (Signed)
Clinical Social Work Department PSYCHOSOCIAL ASSESSMENT - MATERNAL/CHILD 10/08/2014  Patient:  [name] [name], [name] [name]  Account Number:  [id number]  [place] Date:  10/08/2014  Ardine Eng Name:   "Oswaldo Milian"    Clinical Social Worker:  Terri Piedra, LCSW   Date/Time:  10/08/2014 12:30 PM  Date Referred:  10/08/2014   Referral source  NICU     Referred reason  Substance Abuse  NICU   Other referral source:    I:  FAMILY / [place] legal guardian:    Guardian - Name Guardian - Age Guardian - Address  Azalea Cedar 28 MOB does not have a stable residence  Unknown     Other household support members/support persons Other support:   MOB states her father has custody of her three other children at this time.  She does not state any other support people, and adds that her mother passed away in 03-21-23.  CSW recalls that her mother was a support for MOB twos years ago when her last baby was born.    II  PSYCHOSOCIAL DATA Information Source:  Patient Interview  Occupational hygienist Employment:   N/A   Financial resources:  Medicaid If Medicaid - County:  [employer] / Grade:   Maternity Care Coordinator / Child Services Coordination / Early Interventions:  Cultural issues impacting care:   None stated    III  STRENGTHS Strengths  Other - See comment   Strength comment:  MOB states a willingness to seek inpatient drug treatment.   IV  RISK FACTORS AND CURRENT PROBLEMS Current Problem:  YES   Risk Factor & Current Problem Patient Issue Family Issue Risk Factor / Current Problem Comment  Substance Abuse Y N MOB positive for cocaine and THC on admission.  MOB has hx of polysubstance use.  Housing Concerns Y N MOB states she has been living "from place to place."  Other - See comment Y N CPS has previously removed custody of MOB's other children.  TRANSPORTATION Y N     V  SOCIAL WORK ASSESSMENT  CSW met with MOB to complete assessment due to substance  abuse, NICU admission and report of no custody of other children.  CSW is familiar with MOB from her last child born in 2013.  MOB was lying in bed covered in her blankets when CSW arrived.  She allowed CSW to enter, and was willing to talk, although was clearly not enthused about it.  She states she remembers CSW as well.  CSW asked how MOB is feeling.  She just nodded.  CSW asked about her other children and she states her father has custody of them.  CSW asked about her plans for baby and she states she had told "the other social worker" that she was thinking about adoption.  She states that she wanted to have an abortion, but that the clinic "shut down" for a while and she was too far along when it reopened.  CSW asked how she is feeling about making an adoption plan at this time and she told CSW that she feels it is what she needs to do.  CSW asked about her motivation for making this plan.  She states she is not in the place to take care of a baby.  She admits that she is using drugs and that she does not have a stable place to live.  CSW commended her for making this decision and for knowing what is best for herself and her baby  at this time in her life.  CSW asked if she has contacted and agency and she said no.  She asked CSW to contact one for her.  We discussed a few possibilities, as she declined wanting to see a list, and the decision was made for CSW to call [employer].  MOB wishes for them to come 10/09/14 am.  CSW called Doris W./CAS and she will come around 10am on 10/09/14.  MOB had no further questions about this.  CSW briefly spoke about closed vs open adoptions and asked MOB if she would like to have any contact with the baby or the medical team.  She said no.  CSW informed the medical team of this.  Therefore, no updates should be given to MOB and she should not be asked to sign consent for treatment.  Once relinquishment paperwork is signed on 10/09/14, the adoption agency will  have custody of the baby until an adoptive family is identified and the agency can make all medical decisions.  Medical team is aware.  CSW asked MOB if she is willing to go to inpatient substance abuse treatment.  She agrees and states she was supposed to go to [facility] when she was released from jail, but she got out early and wasn't taken for the appointment.  CSW called [facility] and arranged an appointment for Thursday, 10/10/14 at [place] states she [facility] at this [facility], which would be her day of discharge.  She declines wanting an early discharge at this time.  CSW will provide MOB with a bus pass to get to her appointment.  She will need to be discharged very early on 10/10/14 in order to get to the appointment with the counselor.  She will not necessarily get a bed this day, but will have the evaluation in order to be placed on a bed list.  MOB is in full agreement with this plan at this time.  CSW also gave MOB a list of inpatient programs in the state for future reference.  Her affect is extremely flat, but she thanked CSW for the assistance.  CSW will also assist and support as needed when the adoption agency arrives.  MOB states no further questions or needs at this time.    VI SOCIAL WORK PLAN Social Work Plan  Information/Referral to [employer]  Psychosocial Support/Ongoing Assessment of Needs  Patient/Family Education   Type of pt/family education:   Adoption information  Emotional/Mental Health/Substance Abuse follow up information  Information regarding CPS involvement   If child protective services report - county:   If child protective services report - date:   Information/referral to community resources comment:   CSW made referral to [facility].  MOB has an appointment with a counselor on 10/10/14 at [place] contacted Ailene Ards with [employer] to assist MOB with adoption.   Other social work  plan:   CSW will make report to Child Protective Services if MOB changes her mind about making an Visual merchandiser.

## 2014-10-09 MED ORDER — LIDOCAINE HCL (PF) 1 % IJ SOLN
5.0000 mL | Freq: Once | INTRAMUSCULAR | Status: DC
  Filled 2014-10-09: qty 5

## 2014-10-09 NOTE — Progress Notes (Signed)
Relinquishment papers signed for adoption of baby.  Paperwork in chart.  [facility] now has custody of infant.  Doris Woodward/Director can be contacted at [phone] at any time for medical consent.  MOB to be discharged tomorrow if medically ready and will have an appointment at [facility] for evaluation for inpatient substance abuse treatment.  Bus passes provided to get to appointment at 8am on 10/10/14. 

## 2014-10-09 NOTE — Progress Notes (Signed)
Patient left floor and has not returned.  Security notified with no success locating her.

## 2014-10-09 NOTE — Progress Notes (Signed)
Post Partum Day 1  Subjective:  [name] [name] is a 28 y.o. [id number] 3118w6d s/p SVD.  No acute events overnight.  Pt denies problems with ambulating, voiding or po intake.  She denies nausea or vomiting.  Pain is well controlled.  She has had flatus. She has had bowel movement.  Lochia Minimal.  Plan for birth control is bilateral tubal ligation.  Method of Feeding: None  Objective: Blood pressure 111/51, pulse 63, temperature 97.8 F (36.6 C), temperature source Oral, resp. rate 16, height 5\' 10"  (1.778 m), weight 81.647 kg (180 lb), last menstrual period 02/27/2014, SpO2 100 %, unknown if currently breastfeeding.  Physical Exam:  General: alert, cooperative and no distress Lochia:normal flow Chest: CTAB Heart: RRR no m/r/g Abdomen: +BS, soft, nontender,  Uterine Fundus: firm DVT Evaluation: No evidence of DVT seen on physical exam. Extremities: No edema   Recent Labs  10/08/14 0145  HGB 9.0*  HCT 25.3*    Assessment/Plan:  ASSESSMENT: [name] [name] is a 28 y.o. [id number] 4018w6d s/p SVD  Plan for discharge tomorrow   LOS: 1 day   Aldona BarKoch, Nyles Mitton L 10/09/2014, 8:59 AM

## 2014-10-10 ENCOUNTER — Encounter: Payer: Medicaid Other | Admitting: Family

## 2014-10-10 NOTE — Discharge Summary (Signed)
Obstetric Discharge Summary Reason for Admission: preterm deliverey outside of hospital Prenatal Procedures: ultrasound Intrapartum Procedures: spontaneous vaginal delivery Postpartum Procedures: none Complications-Operative and Postpartum: crack cocaine, left AMA without telling anyone   [name] ChimesHeather M [name] is a 28 y.o. [id number] female at 1236w6d presenting after a SVD at home. She states that she had been using crack cocaine all day. She had sudden onset of pain after leaving her motel room. [facility] by Caren GriffinsAshlee Scott, EMS provider.    HEMOGLOBIN  Date Value Ref Range Status  10/08/2014 9.0* 12.0 - 15.0 g/dL Final   HCT  Date Value Ref Range Status  10/08/2014 25.3* 36.0 - 46.0 % Final    Physical Exam:   Unable to perform.  Pt was noticed missing from her room around 1900 om 10/09/14 and never returned.  She had relinquished custody of the baby earlier in the day  Discharge Diagnoses: preterm delivery outside of hospital.  Crack cocaine abuse.   Discharge Information: Date: 10/10/2014   LEFT AMA WITHOUT TELLING ANYONE  Newborn Data: Live born female  Birth Weight: 3 lb 10.9 oz (1670 g) APGAR: 5, 8  Home with in NICU.  [facility]ustody of [facility].  [name],[name] 10/10/2014, 7:11 AM

## 2014-10-12 ENCOUNTER — Emergency Department (HOSPITAL_COMMUNITY): Payer: Medicaid Other

## 2014-10-12 ENCOUNTER — Encounter (HOSPITAL_COMMUNITY): Payer: Self-pay | Admitting: *Deleted

## 2014-10-12 ENCOUNTER — Emergency Department (HOSPITAL_COMMUNITY)
Admission: EM | Admit: 2014-10-12 | Discharge: 2014-10-12 | Disposition: A | Discharge status: Home / Self Care | Payer: Medicaid Other | Attending: Emergency Medicine | Admitting: Emergency Medicine

## 2014-10-12 DIAGNOSIS — D649 Anemia, unspecified: Secondary | ICD-10-CM | POA: Insufficient documentation

## 2014-10-12 DIAGNOSIS — R6883 Chills (without fever): Secondary | ICD-10-CM | POA: Insufficient documentation

## 2014-10-12 DIAGNOSIS — M791 Myalgia: Secondary | ICD-10-CM | POA: Insufficient documentation

## 2014-10-12 DIAGNOSIS — R1011 Right upper quadrant pain: Secondary | ICD-10-CM

## 2014-10-12 DIAGNOSIS — N390 Urinary tract infection, site not specified: Secondary | ICD-10-CM

## 2014-10-12 DIAGNOSIS — F1210 Cannabis abuse, uncomplicated: Secondary | ICD-10-CM | POA: Insufficient documentation

## 2014-10-12 DIAGNOSIS — Z8619 Personal history of other infectious and parasitic diseases: Secondary | ICD-10-CM | POA: Insufficient documentation

## 2014-10-12 DIAGNOSIS — Z8659 Personal history of other mental and behavioral disorders: Secondary | ICD-10-CM | POA: Insufficient documentation

## 2014-10-12 DIAGNOSIS — Z87891 Personal history of nicotine dependence: Secondary | ICD-10-CM | POA: Insufficient documentation

## 2014-10-12 DIAGNOSIS — F1410 Cocaine abuse, uncomplicated: Secondary | ICD-10-CM | POA: Diagnosis not present

## 2014-10-12 DIAGNOSIS — Z79899 Other long term (current) drug therapy: Secondary | ICD-10-CM | POA: Diagnosis not present

## 2014-10-12 HISTORY — DX: Other psychoactive substance abuse, uncomplicated: F19.10

## 2014-10-12 LAB — URINALYSIS, ROUTINE W REFLEX MICROSCOPIC
Bilirubin Urine: NEGATIVE
Glucose, UA: NEGATIVE mg/dL
KETONES UR: NEGATIVE mg/dL
NITRITE: POSITIVE — AB
PROTEIN: 30 mg/dL — AB
Specific Gravity, Urine: 1.02 (ref 1.005–1.030)
Urobilinogen, UA: 1 mg/dL (ref 0.0–1.0)
pH: 6 (ref 5.0–8.0)

## 2014-10-12 LAB — RAPID URINE DRUG SCREEN, HOSP PERFORMED
Amphetamines: NOT DETECTED
Barbiturates: NOT DETECTED
Benzodiazepines: NOT DETECTED
COCAINE: POSITIVE — AB
Opiates: NOT DETECTED
TETRAHYDROCANNABINOL: POSITIVE — AB

## 2014-10-12 LAB — COMPREHENSIVE METABOLIC PANEL
ALT: 20 U/L (ref 0–35)
AST: 14 U/L (ref 0–37)
Albumin: 3.1 g/dL — ABNORMAL LOW (ref 3.5–5.2)
Alkaline Phosphatase: 124 U/L — ABNORMAL HIGH (ref 39–117)
Anion gap: 14 (ref 5–15)
BUN: 10 mg/dL (ref 6–23)
CALCIUM: 9.3 mg/dL (ref 8.4–10.5)
CO2: 24 meq/L (ref 19–32)
Chloride: 106 mEq/L (ref 96–112)
Creatinine, Ser: 0.76 mg/dL (ref 0.50–1.10)
GLUCOSE: 100 mg/dL — AB (ref 70–99)
Potassium: 3.9 mEq/L (ref 3.7–5.3)
Sodium: 144 mEq/L (ref 137–147)
TOTAL PROTEIN: 7.2 g/dL (ref 6.0–8.3)
Total Bilirubin: 0.2 mg/dL — ABNORMAL LOW (ref 0.3–1.2)

## 2014-10-12 LAB — CBC WITH DIFFERENTIAL/PLATELET
Basophils Absolute: 0 10*3/uL (ref 0.0–0.1)
Basophils Relative: 0 % (ref 0–1)
EOS ABS: 0.3 10*3/uL (ref 0.0–0.7)
Eosinophils Relative: 2 % (ref 0–5)
HEMATOCRIT: 29.3 % — AB (ref 36.0–46.0)
Hemoglobin: 9.6 g/dL — ABNORMAL LOW (ref 12.0–15.0)
LYMPHS PCT: 10 % — AB (ref 12–46)
Lymphs Abs: 1.5 10*3/uL (ref 0.7–4.0)
MCH: 31.8 pg (ref 26.0–34.0)
MCHC: 32.8 g/dL (ref 30.0–36.0)
MCV: 97 fL (ref 78.0–100.0)
MONO ABS: 1.5 10*3/uL — AB (ref 0.1–1.0)
Monocytes Relative: 9 % (ref 3–12)
Neutro Abs: 12.6 10*3/uL — ABNORMAL HIGH (ref 1.7–7.7)
Neutrophils Relative %: 79 % — ABNORMAL HIGH (ref 43–77)
Platelets: 308 10*3/uL (ref 150–400)
RBC: 3.02 MIL/uL — ABNORMAL LOW (ref 3.87–5.11)
RDW: 13.1 % (ref 11.5–15.5)
WBC: 15.9 10*3/uL — AB (ref 4.0–10.5)

## 2014-10-12 LAB — WET PREP, GENITAL
Trich, Wet Prep: NONE SEEN
Yeast Wet Prep HPF POC: NONE SEEN

## 2014-10-12 LAB — URINE MICROSCOPIC-ADD ON

## 2014-10-12 LAB — LIPASE, BLOOD: LIPASE: 26 U/L (ref 11–59)

## 2014-10-12 MED ORDER — HYDROMORPHONE HCL 1 MG/ML IJ SOLN
1.0000 mg | Freq: Once | INTRAMUSCULAR | Status: AC
  Administered 2014-10-12: 1 mg via INTRAVENOUS
  Filled 2014-10-12: qty 1

## 2014-10-12 MED ORDER — IOHEXOL 300 MG/ML  SOLN
50.0000 mL | Freq: Once | INTRAMUSCULAR | Status: AC | PRN
  Administered 2014-10-12: 50 mL via ORAL

## 2014-10-12 MED ORDER — ONDANSETRON HCL 4 MG/2ML IJ SOLN
4.0000 mg | Freq: Once | INTRAMUSCULAR | Status: AC
  Administered 2014-10-12: 4 mg via INTRAVENOUS
  Filled 2014-10-12: qty 2

## 2014-10-12 MED ORDER — ACETAMINOPHEN 325 MG PO TABS
650.0000 mg | ORAL_TABLET | Freq: Once | ORAL | Status: AC
  Administered 2014-10-12: 650 mg via ORAL
  Filled 2014-10-12: qty 2

## 2014-10-12 MED ORDER — SODIUM CHLORIDE 0.9 % IV BOLUS (SEPSIS)
1000.0000 mL | INTRAVENOUS | Status: AC
  Administered 2014-10-12: 1000 mL via INTRAVENOUS

## 2014-10-12 MED ORDER — CEPHALEXIN 500 MG PO CAPS: 500.0000 mg | ORAL_CAPSULE | Freq: Four times a day (QID) | ORAL | Status: DC

## 2014-10-12 MED ORDER — DEXTROSE 5 % IV SOLN
1.0000 g | Freq: Once | INTRAVENOUS | Status: AC
  Administered 2014-10-12: 1 g via INTRAVENOUS
  Filled 2014-10-12: qty 10

## 2014-10-12 MED ORDER — HYDROMORPHONE HCL 1 MG/ML IJ SOLN
1.0000 mg | INTRAMUSCULAR | Status: AC
  Administered 2014-10-12: 1 mg via INTRAVENOUS
  Filled 2014-10-12: qty 1

## 2014-10-12 MED ORDER — IOHEXOL 300 MG/ML  SOLN
100.0000 mL | Freq: Once | INTRAMUSCULAR | Status: AC | PRN
  Administered 2014-10-12: 100 mL via INTRAVENOUS

## 2014-10-12 NOTE — ED Notes (Signed)
Pt reports using 3-4 pads every hour for vaginal bleeding however is only using toilet tissue at this time. She denies using any using drugs or medication today or since her delivery.

## 2014-10-12 NOTE — ED Notes (Signed)
Pt states mother is dead. Social work note from several days ago says the same. [place] longer [place] at [employer]. Pt reinformed of need to call ride to go home.

## 2014-10-12 NOTE — ED Notes (Signed)
Pt transported to CT ?

## 2014-10-12 NOTE — ED Notes (Signed)
Asked patient if she could call for a ride she states " I don't have any numbers to call".

## 2014-10-12 NOTE — Discharge Instructions (Signed)
Abdominal Pain, Women °Abdominal (stomach, pelvic, or belly) pain can be caused by many things. It is important to tell your doctor: °· The location of the pain. °· Does it come and go or is it present all the time? °· Are there things that start the pain (eating certain foods, exercise)? °· Are there other symptoms associated with the pain (fever, nausea, vomiting, diarrhea)? °All of this is helpful to know when trying to find the cause of the pain. °CAUSES  °· Stomach: virus or bacteria infection, or ulcer. °· Intestine: appendicitis (inflamed appendix), regional ileitis (Crohn's disease), ulcerative colitis (inflamed colon), irritable bowel syndrome, diverticulitis (inflamed diverticulum of the colon), or cancer of the stomach or intestine. °· Gallbladder disease or stones in the gallbladder. °· Kidney disease, kidney stones, or infection. °· Pancreas infection or cancer. °· Fibromyalgia (pain disorder). °· Diseases of the female organs: °¨ Uterus: fibroid (non-cancerous) tumors or infection. °¨ Fallopian tubes: infection or tubal pregnancy. °¨ Ovary: cysts or tumors. °¨ Pelvic adhesions (scar tissue). °¨ Endometriosis (uterus lining tissue growing in the pelvis and on the pelvic organs). °¨ Pelvic congestion syndrome (female organs filling up with blood just before the menstrual period). °¨ Pain with the menstrual period. °¨ Pain with ovulation (producing an egg). °¨ Pain with an IUD (intrauterine device, birth control) in the uterus. °¨ Cancer of the female organs. °· Functional pain (pain not caused by a disease, may improve without treatment). °· Psychological pain. °· Depression. °DIAGNOSIS  °Your doctor will decide the seriousness of your pain by doing an examination. °· Blood tests. °· X-rays. °· Ultrasound. °· CT scan (computed tomography, special type of X-ray). °· MRI (magnetic resonance imaging). °· Cultures, for infection. °· Barium enema (dye inserted in the large intestine, to better view it with  X-rays). °· Colonoscopy (looking in intestine with a lighted tube). °· Laparoscopy (minor surgery, looking in abdomen with a lighted tube). °· Major abdominal exploratory surgery (looking in abdomen with a large incision). °TREATMENT  °The treatment will depend on the cause of the pain.  °· Many cases can be observed and treated at home. °· Over-the-counter medicines recommended by your caregiver. °· Prescription medicine. °· Antibiotics, for infection. °· Birth control pills, for painful periods or for ovulation pain. °· Hormone treatment, for endometriosis. °· Nerve blocking injections. °· Physical therapy. °· Antidepressants. °· Counseling with a psychologist or psychiatrist. °· Minor or major surgery. °HOME CARE INSTRUCTIONS  °· Do not take laxatives, unless directed by your caregiver. °· Take over-the-counter pain medicine only if ordered by your caregiver. Do not take aspirin because it can cause an upset stomach or bleeding. °· Try a clear liquid diet (broth or water) as ordered by your caregiver. Slowly move to a bland diet, as tolerated, if the pain is related to the stomach or intestine. °· Have a thermometer and take your temperature several times a day, and record it. °· Bed rest and sleep, if it helps the pain. °· Avoid sexual intercourse, if it causes pain. °· Avoid stressful situations. °· Keep your follow-up appointments and tests, as your caregiver orders. °· If the pain does not go away with medicine or surgery, you may try: °¨ Acupuncture. °¨ Relaxation exercises (yoga, meditation). °¨ Group therapy. °¨ Counseling. °SEEK MEDICAL CARE IF:  °· You notice certain foods cause stomach pain. °· Your home care treatment is not helping your pain. °· You need stronger pain medicine. °· You want your IUD removed. °· You feel faint or   lightheaded. °· You develop nausea and vomiting. °· You develop a rash. °· You are having side effects or an allergy to your medicine. °SEEK IMMEDIATE MEDICAL CARE IF:  °· Your  pain does not go away or gets worse. °· You have a fever. °· Your pain is felt only in portions of the abdomen. The right side could possibly be appendicitis. The left lower portion of the abdomen could be colitis or diverticulitis. °· You are passing blood in your stools (bright red or black tarry stools, with or without vomiting). °· You have blood in your urine. °· You develop chills, with or without a fever. °· You pass out. °MAKE SURE YOU:  °· Understand these instructions. °· Will watch your condition. °· Will get help right away if you are not doing well or get worse. °Document Released: 09/19/2007 Document Revised: 04/08/2014 Document Reviewed: 10/09/2009 °[employer]® Patient Information ©2015 [employer]. This information is not intended to replace advice given to you by your health care provider. Make sure you discuss any questions you have with your health care provider. ° °

## 2014-10-12 NOTE — ED Notes (Signed)
Pt ambulated to restroom to provide a urine sample

## 2014-10-12 NOTE — ED Notes (Signed)
Pt ambulatory.

## 2014-10-12 NOTE — ED Notes (Signed)
Upon entering room to administer medication patient was not arousing to name. Sternal rub provided and patient aroused.

## 2014-10-12 NOTE — ED Notes (Signed)
Bed: WA17 Expected date: 10/12/14 Expected time: 7:32 AM Means of arrival: Ambulance Comments: 28 yo Lower abd pain/ vag odor

## 2014-10-12 NOTE — ED Notes (Signed)
MD at bedside and was made aware of difficulty to arouse. PT is alert and now crying in pain. MD advised to go ahead and give medication. Will continue to monitor patient.

## 2014-10-12 NOTE — ED Notes (Signed)
MD at bedside. 

## 2014-10-12 NOTE — ED Notes (Signed)
Pt returned from XRAY 

## 2014-10-12 NOTE — ED Provider Notes (Signed)
CSN: [id number]     Arrival date & time 10/12/14  4098 History   First MD Initiated Contact with Patient 10/12/14 0745     Chief Complaint  Patient presents with  . Abdominal Pain     (Consider location/radiation/quality/duration/timing/severity/associated sxs/prior Treatment) Patient is a 28 y.o. female presenting with abdominal pain. The history is provided by the patient.  Abdominal Pain Pain location:  RUQ Pain quality: stabbing   Pain radiates to:  Does not radiate Pain severity:  Moderate Onset quality:  Gradual Duration:  3 days Timing:  Constant Progression:  Worsening Chronicity:  New Context comment:  At rest Relieved by:  Nothing Worsened by:  Nothing tried Ineffective treatments:  None tried Associated symptoms: chills and vaginal discharge (mildly bloody, foul odor)   Associated symptoms: no chest pain, no cough, no diarrhea, no dysuria, no fatigue, no fever, no hematuria, no nausea, no shortness of breath and no vomiting     Past Medical History  Diagnosis Date  . Abnormal Pap smear   . UTI (lower urinary tract infection)   . Trichimoniasis   . Anemia   . Anxiety   . Vaginal Pap smear, abnormal   . Depression   . Kidney infection    Past Surgical History  Procedure Laterality Date  . Induced abortion     Family History  Problem Relation Age of Onset  . Cancer Mother   . Hypertension Mother   . Diabetes Father   . Hypertension Father   . Hyperlipidemia Father   . Mental illness Maternal Grandmother    History  Substance Use Topics  . Smoking status: Former Smoker -- 0.50 packs/day    Types: Cigarettes  . Smokeless tobacco: Never Used  . Alcohol Use: 2.4 oz/week    4 Cans of beer per week     Comment: occasional    OB History    Gravida Para Term Preterm AB TAB SAB Ectopic Multiple Living   5 4  4 1 1    0 4     Review of Systems  Constitutional: Positive for chills. Negative for fever and fatigue.  HENT: Negative for congestion and  drooling.   Eyes: Negative for pain.  Respiratory: Negative for cough and shortness of breath.   Cardiovascular: Negative for chest pain.  Gastrointestinal: Positive for abdominal pain. Negative for nausea, vomiting and diarrhea.  Genitourinary: Positive for vaginal discharge (mildly bloody, foul odor). Negative for dysuria and hematuria.  Musculoskeletal: Positive for myalgias. Negative for back pain, gait problem and neck pain.  Skin: Negative for color change.  Neurological: Negative for dizziness and headaches.  Hematological: Negative for adenopathy.  Psychiatric/Behavioral: Negative for behavioral problems.  All other systems reviewed and are negative.     Allergies  Review of patient's allergies indicates no known allergies.  Home Medications   Prior to Admission medications   Medication Sig Start Date End Date Taking? Authorizing Provider  acetaminophen (TYLENOL) 325 MG tablet Take 650 mg by mouth every 6 (six) hours as needed.    Historical Provider, MD  nitrofurantoin, macrocrystal-monohydrate, (MACROBID) 100 MG capsule Take 1 capsule (100 mg total) by mouth 2 (two) times daily. Patient not taking: Reported on 10/08/2014 09/26/14   Dorathy Kinsman, CNM  pantoprazole (PROTONIX) 20 MG tablet Take 1 tablet (20 mg total) by mouth daily. Patient not taking: Reported on 10/08/2014 09/03/14   Aviva Signs, CNM  Prenatal Vit-Fe Fumarate-FA (PRENATAL MULTIVITAMIN) TABS tablet Take 1 tablet by mouth daily at 12 noon.  Historical Provider, MD   Pulse 95  Temp(Src) 99.5 F (37.5 C) (Oral)  Resp 25  SpO2 100%  LMP 02/27/2014 (Approximate) Physical Exam  Constitutional: She is oriented to person, place, and time. She appears well-developed and well-nourished.  HENT:  Head: Normocephalic.  Mouth/Throat: Oropharynx is clear and moist. No oropharyngeal exudate.  Eyes: Conjunctivae and EOM are normal. Pupils are equal, round, and reactive to light.  Neck: Normal range of motion.  Neck supple.  Cardiovascular: Normal rate, regular rhythm, normal heart sounds and intact distal pulses.  Exam reveals no gallop and no friction rub.   No murmur heard. Pulmonary/Chest: Effort normal and breath sounds normal. No respiratory distress. She has no wheezes.  Abdominal: Soft. Bowel sounds are normal. There is tenderness (mild to mod ttp of RUQ). There is no rebound and no guarding.  Genitourinary:  Mild amount of bloody and brownish discharge in the vaginal vault. Difficulty visualizing the cervix. No cervical motion tenderness or tenderness during the bimanual exam.  Musculoskeletal: Normal range of motion. She exhibits no edema or tenderness.  Neurological: She is alert and oriented to person, place, and time.  Skin: Skin is warm and dry.  Psychiatric: She has a normal mood and affect. Her behavior is normal.  Nursing note and vitals reviewed.   ED Course  Procedures (including critical care time) Labs Review Labs Reviewed  WET PREP, GENITAL - Abnormal; Notable for the following:    Clue Cells Wet Prep HPF POC FEW (*)    WBC, Wet Prep HPF POC MANY (*)    All other components within normal limits  CBC WITH DIFFERENTIAL - Abnormal; Notable for the following:    WBC 15.9 (*)    RBC 3.02 (*)    Hemoglobin 9.6 (*)    HCT 29.3 (*)    Neutrophils Relative % 79 (*)    Neutro Abs 12.6 (*)    Lymphocytes Relative 10 (*)    Monocytes Absolute 1.5 (*)    All other components within normal limits  COMPREHENSIVE METABOLIC PANEL - Abnormal; Notable for the following:    Glucose, Bld 100 (*)    Albumin 3.1 (*)    Alkaline Phosphatase 124 (*)    Total Bilirubin 0.2 (*)    All other components within normal limits  URINALYSIS, ROUTINE W REFLEX MICROSCOPIC - Abnormal; Notable for the following:    Color, Urine AMBER (*)    APPearance TURBID (*)    Hgb urine dipstick LARGE (*)    Protein, ur 30 (*)    Nitrite POSITIVE (*)    Leukocytes, UA LARGE (*)    All other components  within normal limits  URINE RAPID DRUG SCREEN (HOSP PERFORMED) - Abnormal; Notable for the following:    Cocaine POSITIVE (*)    Tetrahydrocannabinol POSITIVE (*)    All other components within normal limits  URINE MICROSCOPIC-ADD ON - Abnormal; Notable for the following:    Squamous Epithelial / LPF FEW (*)    Bacteria, UA MANY (*)    All other components within normal limits  GC/CHLAMYDIA PROBE AMP  LIPASE, BLOOD    Imaging Review Dg Chest 2 View  10/12/2014   CLINICAL DATA:  28 year old recently postpartum female with acute onset severe right lower quadrant pain.  EXAM: CHEST  2 VIEW  COMPARISON:  Prior chest x-ray 07/06/2011  FINDINGS: The lungs are clear and negative for focal airspace consolidation, pulmonary edema or suspicious pulmonary nodule. No pleural effusion or pneumothorax. Cardiac and mediastinal  contours are within normal limits. No acute fracture or lytic or blastic osseous lesions. The visualized upper abdominal bowel gas pattern is unremarkable.  IMPRESSION: No active cardiopulmonary disease.   Electronically Signed   By: Malachy MoanHeath  McCullough M.D.   On: 10/12/2014 08:53   Ct Abdomen Pelvis W Contrast  10/12/2014   CLINICAL DATA:  Right lower quadrant abdominal pain beginning this morning. Three days postpartum after delivery of premature infant.  EXAM: CT ABDOMEN AND PELVIS WITH CONTRAST  TECHNIQUE: Multidetector CT imaging of the abdomen and pelvis was performed using the standard protocol following bolus administration of intravenous contrast.  CONTRAST:  50mL OMNIPAQUE IOHEXOL 300 MG/ML SOLN, 100mL OMNIPAQUE IOHEXOL 300 MG/ML SOLN  COMPARISON:  No similar prior exam is available at this institution for comparison or on [facility] PACS.  FINDINGS: Lung bases are clear.  Liver, gallbladder, kidneys, adrenal glands, spleen, and pancreas are normal. No free air or fluid. No lymphadenopathy.  No bowel wall thickening or focal segmental dilatation is identified. Normal appendix, image 65.   Enlarged globular uterus compatible with recent postpartum state. Anterior myometrial uterine enhancement most likely related to recently postpartum state. Ovaries appear normal. No pelvic free fluid. Bladder is unremarkable allowing for decompression.  No acute osseous abnormality.  IMPRESSION: No acute intra abdominal or pelvic pathology.   Electronically Signed   By: Christiana PellantGretchen  Green M.D.   On: 10/12/2014 10:14     EKG Interpretation None      MDM   Final diagnoses:  RUQ pain  UTI (lower urinary tract infection)    7:48 AM 28 y.o. female [id number] w/ RLQ pain after a SVD at home on 10/08/14.  On 11/3 she states that she had been using crack cocaine all day. She had sudden onset of pain after leaving her motel room. [facility] Caren Griffins[facility]lee Scott, EMS provider. she states that since being discharged on 11/5 she has had gradually worsening right upper quadrant sharp pain. She denies any fevers, vomiting. She has had some chills and body aches. She is afebrile and vital signs are unremarkable here. We'll get screening labs and imaging. Will perform pelvic exam.  She denies any drug use or sex since the birth of her child on 11/3.   I discussed the case w/ Dr. Jolayne Pantheronstant (obgyn). Doubt endometritis given normal pelvic exam w/out ttp of uterus or adnexa. Pt found to have UTI, otherwise unremarkable workup. Will give 1g rocephin here and Rx for keflex for UTI per Dr. Deretha Emoryonstant's recs.   2:36 PM: Pt was monitored and continued to appear well. She was able to tolerate solid/liquid intake and ambulatory on d/c w/ minimal discomfort.  I have discussed the diagnosis/risks/treatment options with the patient and believe the pt to be eligible for discharge home to follow-up with obgyn as scheduled. We also discussed returning to the ED immediately if new or worsening sx occur. We discussed the sx which are most concerning (e.g., worsening pain, fever, vomiting, inc lochia) that necessitate  immediate return. Medications administered to the patient during their visit and any new prescriptions provided to the patient are listed below.  Medications given during this visit Medications  HYDROmorphone (DILAUDID) injection 1 mg (1 mg Intravenous Given 10/12/14 0822)  sodium chloride 0.9 % bolus 1,000 mL (0 mLs Intravenous Stopped 10/12/14 0945)  ondansetron (ZOFRAN) injection 4 mg (4 mg Intravenous Given 10/12/14 0811)  iohexol (OMNIPAQUE) 300 MG/ML solution 50 mL (50 mLs Oral Contrast Given 10/12/14 0811)  acetaminophen (TYLENOL) tablet  650 mg (650 mg Oral Given 10/12/14 0914)  HYDROmorphone (DILAUDID) injection 1 mg (1 mg Intravenous Given 10/12/14 0915)  iohexol (OMNIPAQUE) 300 MG/ML solution 100 mL (100 mLs Intravenous Contrast Given 10/12/14 0958)  cefTRIAXone (ROCEPHIN) 1 g in dextrose 5 % 50 mL IVPB (0 g Intravenous Stopped 10/12/14 1316)    Discharge Medication List as of 10/12/2014  2:37 PM    START taking these medications   Details  cephALEXin (KEFLEX) 500 MG capsule Take 1 capsule (500 mg total) by mouth 4 (four) times daily., Starting 10/12/2014, Until Discontinued, Print         Purvis SheffieldForrest Gianno Volner, MD 10/12/14 [phone]

## 2014-10-12 NOTE — ED Notes (Signed)
Pt gave  Birth 3 days ago to premature infant, this am woke with severe RLQ pain, non radiating, vag odor without discharge.

## 2014-10-12 NOTE — ED Notes (Signed)
Patient transported to CT 

## 2014-10-12 NOTE — ED Notes (Signed)
PT aware of the need for a urine sample however is unable to void at this time.

## 2014-10-12 NOTE — ED Notes (Signed)
Put pt on bed pan, went back in and was not very responsive, also stated she had not urinated

## 2014-10-12 NOTE — ED Notes (Signed)
Patient transported to X-ray 

## 2014-10-14 LAB — GC/CHLAMYDIA PROBE AMP
CT Probe RNA: POSITIVE — AB
GC Probe RNA: NEGATIVE

## 2014-10-15 ENCOUNTER — Telehealth (HOSPITAL_BASED_OUTPATIENT_CLINIC_OR_DEPARTMENT_OTHER): Payer: Self-pay | Admitting: Emergency Medicine

## 2014-10-15 NOTE — Telephone Encounter (Signed)
Patient with recent vaginal delivery now + chlamydia, not treated, to EDP 10/15/14

## 2014-10-16 ENCOUNTER — Encounter: Payer: Self-pay | Admitting: *Deleted

## 2014-10-16 ENCOUNTER — Telehealth (HOSPITAL_BASED_OUTPATIENT_CLINIC_OR_DEPARTMENT_OTHER): Payer: Self-pay | Admitting: Emergency Medicine

## 2014-10-23 ENCOUNTER — Encounter (HOSPITAL_COMMUNITY): Payer: Self-pay | Admitting: *Deleted

## 2014-10-28 ENCOUNTER — Telehealth (HOSPITAL_BASED_OUTPATIENT_CLINIC_OR_DEPARTMENT_OTHER): Payer: Self-pay | Admitting: *Deleted

## 2014-11-20 ENCOUNTER — Encounter: Payer: Self-pay | Admitting: Obstetrics & Gynecology

## 2014-11-20 ENCOUNTER — Ambulatory Visit: Payer: Medicaid Other | Admitting: Obstetrics & Gynecology

## 2015-02-28 ENCOUNTER — Encounter (HOSPITAL_COMMUNITY): Payer: Self-pay | Admitting: Emergency Medicine

## 2015-02-28 ENCOUNTER — Emergency Department (HOSPITAL_COMMUNITY)
Admission: EM | Admit: 2015-02-28 | Discharge: 2015-02-28 | Disposition: A | Discharge status: Home / Self Care | Payer: Medicaid Other | Attending: Emergency Medicine | Admitting: Emergency Medicine

## 2015-02-28 DIAGNOSIS — Z8619 Personal history of other infectious and parasitic diseases: Secondary | ICD-10-CM | POA: Diagnosis not present

## 2015-02-28 DIAGNOSIS — Z79899 Other long term (current) drug therapy: Secondary | ICD-10-CM | POA: Insufficient documentation

## 2015-02-28 DIAGNOSIS — R400 Somnolence: Secondary | ICD-10-CM | POA: Diagnosis not present

## 2015-02-28 DIAGNOSIS — Z87891 Personal history of nicotine dependence: Secondary | ICD-10-CM | POA: Insufficient documentation

## 2015-02-28 DIAGNOSIS — Z8659 Personal history of other mental and behavioral disorders: Secondary | ICD-10-CM | POA: Diagnosis not present

## 2015-02-28 DIAGNOSIS — R1011 Right upper quadrant pain: Secondary | ICD-10-CM | POA: Diagnosis not present

## 2015-02-28 DIAGNOSIS — Z792 Long term (current) use of antibiotics: Secondary | ICD-10-CM | POA: Insufficient documentation

## 2015-02-28 DIAGNOSIS — Z3202 Encounter for pregnancy test, result negative: Secondary | ICD-10-CM | POA: Diagnosis not present

## 2015-02-28 DIAGNOSIS — D72829 Elevated white blood cell count, unspecified: Secondary | ICD-10-CM | POA: Diagnosis not present

## 2015-02-28 DIAGNOSIS — Z590 Homelessness: Secondary | ICD-10-CM | POA: Insufficient documentation

## 2015-02-28 DIAGNOSIS — D649 Anemia, unspecified: Secondary | ICD-10-CM | POA: Insufficient documentation

## 2015-02-28 DIAGNOSIS — Z8742 Personal history of other diseases of the female genital tract: Secondary | ICD-10-CM | POA: Diagnosis not present

## 2015-02-28 DIAGNOSIS — Z9889 Other specified postprocedural states: Secondary | ICD-10-CM | POA: Insufficient documentation

## 2015-02-28 DIAGNOSIS — Z87440 Personal history of urinary (tract) infections: Secondary | ICD-10-CM | POA: Insufficient documentation

## 2015-02-28 LAB — URINE MICROSCOPIC-ADD ON

## 2015-02-28 LAB — CBC WITH DIFFERENTIAL/PLATELET
Basophils Absolute: 0 10*3/uL (ref 0.0–0.1)
Basophils Relative: 0 % (ref 0–1)
EOS PCT: 1 % (ref 0–5)
Eosinophils Absolute: 0.1 10*3/uL (ref 0.0–0.7)
HCT: 33.4 % — ABNORMAL LOW (ref 36.0–46.0)
Hemoglobin: 10.9 g/dL — ABNORMAL LOW (ref 12.0–15.0)
LYMPHS PCT: 18 % (ref 12–46)
Lymphs Abs: 2 10*3/uL (ref 0.7–4.0)
MCH: 30.2 pg (ref 26.0–34.0)
MCHC: 32.6 g/dL (ref 30.0–36.0)
MCV: 92.5 fL (ref 78.0–100.0)
Monocytes Absolute: 1.3 10*3/uL — ABNORMAL HIGH (ref 0.1–1.0)
Monocytes Relative: 12 % (ref 3–12)
Neutro Abs: 7.9 10*3/uL — ABNORMAL HIGH (ref 1.7–7.7)
Neutrophils Relative %: 69 % (ref 43–77)
PLATELETS: 280 10*3/uL (ref 150–400)
RBC: 3.61 MIL/uL — AB (ref 3.87–5.11)
RDW: 13.7 % (ref 11.5–15.5)
WBC: 11.4 10*3/uL — AB (ref 4.0–10.5)

## 2015-02-28 LAB — URINALYSIS, ROUTINE W REFLEX MICROSCOPIC
Bilirubin Urine: NEGATIVE
Glucose, UA: NEGATIVE mg/dL
HGB URINE DIPSTICK: NEGATIVE
KETONES UR: NEGATIVE mg/dL
Leukocytes, UA: NEGATIVE
NITRITE: POSITIVE — AB
PROTEIN: NEGATIVE mg/dL
Specific Gravity, Urine: 1.036 — ABNORMAL HIGH (ref 1.005–1.030)
UROBILINOGEN UA: 0.2 mg/dL (ref 0.0–1.0)
pH: 5.5 (ref 5.0–8.0)

## 2015-02-28 LAB — COMPREHENSIVE METABOLIC PANEL
ALK PHOS: 77 U/L (ref 39–117)
ALT: 16 U/L (ref 0–35)
ANION GAP: 7 (ref 5–15)
AST: 12 U/L (ref 0–37)
Albumin: 3.8 g/dL (ref 3.5–5.2)
BILIRUBIN TOTAL: 0.4 mg/dL (ref 0.3–1.2)
BUN: 17 mg/dL (ref 6–23)
CHLORIDE: 107 mmol/L (ref 96–112)
CO2: 26 mmol/L (ref 19–32)
Calcium: 8.9 mg/dL (ref 8.4–10.5)
Creatinine, Ser: 0.97 mg/dL (ref 0.50–1.10)
GFR calc non Af Amer: 79 mL/min — ABNORMAL LOW (ref >90)
GLUCOSE: 122 mg/dL — AB (ref 70–99)
Potassium: 3.9 mmol/L (ref 3.5–5.1)
Sodium: 140 mmol/L (ref 135–145)
TOTAL PROTEIN: 7.4 g/dL (ref 6.0–8.3)

## 2015-02-28 LAB — PREGNANCY, URINE: Preg Test, Ur: NEGATIVE

## 2015-02-28 LAB — LIPASE, BLOOD: Lipase: 23 U/L (ref 11–59)

## 2015-02-28 MED ORDER — NALOXONE HCL 1 MG/ML IJ SOLN
INTRAMUSCULAR | Status: AC
  Administered 2015-02-28: 1 mg via INTRAMUSCULAR
  Filled 2015-02-28: qty 2

## 2015-02-28 MED ORDER — AMMONIA AROMATIC IN INHA
0.3000 mL | Freq: Once | RESPIRATORY_TRACT | Status: AC
  Administered 2015-02-28: 0.3 mL via RESPIRATORY_TRACT

## 2015-02-28 MED ORDER — IBUPROFEN 800 MG PO TABS
800.0000 mg | ORAL_TABLET | Freq: Once | ORAL | Status: AC
  Administered 2015-02-28: 800 mg via ORAL
  Filled 2015-02-28 (×2): qty 1

## 2015-02-28 MED ORDER — AMMONIA AROMATIC IN INHA
RESPIRATORY_TRACT | Status: AC
  Administered 2015-02-28: 0.3 mL via RESPIRATORY_TRACT
  Filled 2015-02-28: qty 10

## 2015-02-28 MED ORDER — NALOXONE HCL 0.4 MG/ML IJ SOLN
0.4000 mg | Freq: Once | INTRAMUSCULAR | Status: AC
  Administered 2015-02-28: 0.4 mg via INTRAVENOUS
  Filled 2015-02-28: qty 1

## 2015-02-28 MED ORDER — IBUPROFEN 800 MG PO TABS: 800.0000 mg | ORAL_TABLET | Freq: Three times a day (TID) | ORAL | Status: AC

## 2015-02-28 MED ORDER — NALOXONE HCL 1 MG/ML IJ SOLN
1.0000 mg | Freq: Once | INTRAMUSCULAR | Status: AC
  Administered 2015-02-28: 1 mg via INTRAMUSCULAR

## 2015-02-28 NOTE — ED Notes (Addendum)
Per EMS-c/o RUQ pain x2 days. Denies N/V/D. Reports increasing dysuria with intermittent pain. Reports using crack approximately an hour ago. VS: 116/72 HR 86 RR 20 SpO2 98%. A&Ox4. Ambulatory.
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
## 2015-02-28 NOTE — ED Notes (Signed)
MD at bedside. Patient is drowsy but awakens and speaks full/clear sentences. MD at bedside to administer ammonia capsule. AVS explained in detail. No other c/c.

## 2015-02-28 NOTE — ED Notes (Signed)
Bed: [id number] Expected date: 02/28/15 Expected time: 1:54 AM Means of arrival: Ambulance Comments: 29 yo F  Upper abd pain

## 2015-02-28 NOTE — Discharge Instructions (Signed)
Take ibuprofen as needed for pain. Refer to attached documents for more information.  °

## 2018-01-12 ENCOUNTER — Ambulatory Visit (HOSPITAL_COMMUNITY)
Admission: EM | Admit: 2018-01-12 | Discharge: 2018-01-12 | Disposition: A | Discharge status: Home / Self Care | Payer: Medicaid Other | Attending: Family Medicine | Admitting: Family Medicine

## 2018-01-12 ENCOUNTER — Encounter (HOSPITAL_COMMUNITY): Payer: Self-pay | Admitting: Emergency Medicine

## 2018-01-12 ENCOUNTER — Other Ambulatory Visit: Payer: Self-pay

## 2018-01-12 DIAGNOSIS — L03116 Cellulitis of left lower limb: Secondary | ICD-10-CM

## 2018-01-12 MED ORDER — CEFTRIAXONE SODIUM 1 G IJ SOLR
1.0000 g | Freq: Once | INTRAMUSCULAR | Status: AC
  Administered 2018-01-12: 1 g via INTRAMUSCULAR

## 2018-01-12 MED ORDER — CEFTRIAXONE SODIUM 1 G IJ SOLR
INTRAMUSCULAR | Status: AC
  Filled 2018-01-12: qty 10

## 2018-01-12 MED ORDER — AMOXICILLIN-POT CLAVULANATE 875-125 MG PO TABS: 1.0000 | ORAL_TABLET | Freq: Two times a day (BID) | ORAL | 0 refills | Status: DC

## 2018-01-12 NOTE — ED Triage Notes (Signed)
Left knee red with drainage from an opening.  Patient unsure how or what happened.  Patient says first noticed 3 days ago.  Visible redness, swelling and patient reports pain

## 2018-01-12 NOTE — Discharge Instructions (Addendum)
Please return here in 24 hours for re-evaluation.

## 2018-01-12 NOTE — ED Provider Notes (Signed)
[facility]   [id number] 01/12/18 Arrival Time: 1101  ASSESSMENT & PLAN:  1. Cellulitis of left knee     Meds ordered this encounter  Medications  . cefTRIAXone (ROCEPHIN) injection 1 g  . amoxicillin-clavulanate (AUGMENTIN) 875-125 MG tablet    Sig: Take 1 tablet by mouth every 12 (twelve) hours.    Dispense:  14 tablet    Refill:  0   Agrees to return in 24 hours for re-check. Outlined edges of cellulitis today. Will start Augmentin today. OTC analgesics as needed.  Reviewed expectations re: course of current medical issues. Questions answered. Outlined signs and symptoms indicating need for more acute intervention. Patient verbalized understanding. After Visit Summary given.   SUBJECTIVE:  [name] [name] is a 32 y.o. female who presents with complaint of swelling and redness of her L knee. "Think for the past 2-3 days. Just woke up and it was red." No injury reported. Today awoke with "small dark spot that bled a little." Throbbing/aching discomfort is fairly persistent. Afebrile. Ambulatory with discomfort of L knee. Bending knee worsens discomfort. No extremity sensation changes or weakness. No h/o similar. No self treatment. Feels redness is worse today prompting her to be seen.  ROS: As per HPI.  OBJECTIVE: Vitals:   01/12/18 1155  BP: 127/67  Pulse: 89  Resp: (!) 24  Temp: 98.8 F (37.1 C)  TempSrc: Oral  SpO2: 99%    General appearance: alert; no distress Lungs: clear to auscultation bilaterally; recheck RR 18 Heart: regular rate and rhythm Extremities: FROM of L knee with discomfort; no distal swelling Skin: over L knee is erythematous and hot to touch with irregular borders; tender to touch; no fluctuance; mild swelling; just below patella there is a small abrasion; no active bleeding Psychological: alert and cooperative; normal mood and affect  No Known Allergies  Past Medical History:  Diagnosis Date  . Abnormal Pap smear   . Anemia     . Anxiety   . Depression   . Kidney infection   . Substance abuse (HCC)   . Trichimoniasis   . UTI (lower urinary tract infection)   . Vaginal Pap smear, abnormal    Social History   Socioeconomic History  . Marital status: Single    Spouse name: Not on file  . Number of children: Not on file  . Years of education: Not on file  . Highest education level: Not on file  Social Needs  . Financial resource strain: Not on file  . Food insecurity - worry: Not on file  . Food insecurity - inability: Not on file  . Transportation needs - medical: Not on file  . Transportation needs - non-medical: Not on file  Occupational History  . Not on file  Tobacco Use  . Smoking status: Current Every Day Smoker    Packs/day: 0.50    Types: Cigarettes  . Smokeless tobacco: Never Used  Substance and Sexual Activity  . Alcohol use: Yes    Alcohol/week: 2.4 oz    Types: 4 Cans of beer per week    Comment: occasional   . Drug use: Yes    Types: Marijuana, "Crack" cocaine, Cocaine    Comment: former use of cocaine and marijuana   . Sexual activity: Yes    Birth control/protection: Condom, None  Other Topics Concern  . Not on file  Social History Narrative  . Not on file   Family History  Problem Relation Age of Onset  . Cancer  Mother   . Hypertension Mother   . Diabetes Father   . Hypertension Father   . Hyperlipidemia Father   . Mental illness Maternal Grandmother    Past Surgical History:  Procedure Laterality Date  . INDUCED ABORTION       Mardella Layman, MD 01/12/18 1231

## 2018-01-13 ENCOUNTER — Emergency Department (HOSPITAL_COMMUNITY): Payer: Medicaid Other

## 2018-01-13 ENCOUNTER — Encounter (HOSPITAL_COMMUNITY): Payer: Self-pay | Admitting: Emergency Medicine

## 2018-01-13 ENCOUNTER — Emergency Department (HOSPITAL_COMMUNITY)
Admission: EM | Admit: 2018-01-13 | Discharge: 2018-01-13 | Disposition: A | Discharge status: Home / Self Care | Payer: Medicaid Other | Attending: Emergency Medicine | Admitting: Emergency Medicine

## 2018-01-13 DIAGNOSIS — F17210 Nicotine dependence, cigarettes, uncomplicated: Secondary | ICD-10-CM | POA: Diagnosis not present

## 2018-01-13 DIAGNOSIS — M25562 Pain in left knee: Secondary | ICD-10-CM | POA: Diagnosis present

## 2018-01-13 DIAGNOSIS — L02416 Cutaneous abscess of left lower limb: Secondary | ICD-10-CM | POA: Insufficient documentation

## 2018-01-13 DIAGNOSIS — Z23 Encounter for immunization: Secondary | ICD-10-CM | POA: Diagnosis not present

## 2018-01-13 DIAGNOSIS — L03116 Cellulitis of left lower limb: Secondary | ICD-10-CM

## 2018-01-13 LAB — BASIC METABOLIC PANEL
Anion gap: 9 (ref 5–15)
BUN: 11 mg/dL (ref 6–20)
CHLORIDE: 102 mmol/L (ref 101–111)
CO2: 26 mmol/L (ref 22–32)
Calcium: 8.5 mg/dL — ABNORMAL LOW (ref 8.9–10.3)
Creatinine, Ser: 0.65 mg/dL (ref 0.44–1.00)
GFR calc non Af Amer: >60 mL/min (ref >60)
Glucose, Bld: 102 mg/dL — ABNORMAL HIGH (ref 65–99)
POTASSIUM: 3.7 mmol/L (ref 3.5–5.1)
Sodium: 137 mmol/L (ref 135–145)

## 2018-01-13 LAB — CBC WITH DIFFERENTIAL/PLATELET
Basophils Absolute: 0 10*3/uL (ref 0.0–0.1)
Basophils Relative: 0 %
Eosinophils Absolute: 0.1 10*3/uL (ref 0.0–0.7)
Eosinophils Relative: 1 %
HCT: 36 % (ref 36.0–46.0)
HEMOGLOBIN: 11.9 g/dL — AB (ref 12.0–15.0)
Lymphocytes Relative: 15 %
Lymphs Abs: 2.1 10*3/uL (ref 0.7–4.0)
MCH: 31.4 pg (ref 26.0–34.0)
MCHC: 33.1 g/dL (ref 30.0–36.0)
MCV: 95 fL (ref 78.0–100.0)
Monocytes Absolute: 1.5 10*3/uL — ABNORMAL HIGH (ref 0.1–1.0)
Monocytes Relative: 10 %
NEUTROS ABS: 10.8 10*3/uL — AB (ref 1.7–7.7)
NEUTROS PCT: 74 %
Platelets: 251 10*3/uL (ref 150–400)
RBC: 3.79 MIL/uL — AB (ref 3.87–5.11)
RDW: 12.7 % (ref 11.5–15.5)
WBC: 14.5 10*3/uL — ABNORMAL HIGH (ref 4.0–10.5)

## 2018-01-13 MED ORDER — CLINDAMYCIN PHOSPHATE 600 MG/50ML IV SOLN
600.0000 mg | Freq: Once | INTRAVENOUS | Status: AC
  Administered 2018-01-13: 600 mg via INTRAVENOUS
  Filled 2018-01-13: qty 50

## 2018-01-13 MED ORDER — TETANUS-DIPHTH-ACELL PERTUSSIS 5-2.5-18.5 LF-MCG/0.5 IM SUSP
0.5000 mL | Freq: Once | INTRAMUSCULAR | Status: AC
  Administered 2018-01-13: 0.5 mL via INTRAMUSCULAR
  Filled 2018-01-13: qty 0.5

## 2018-01-13 MED ORDER — CLINDAMYCIN HCL 300 MG PO CAPS: 300.0000 mg | ORAL_CAPSULE | Freq: Three times a day (TID) | ORAL | 0 refills | Status: AC

## 2018-01-13 MED ORDER — HYDROMORPHONE HCL 1 MG/ML IJ SOLN
0.5000 mg | Freq: Once | INTRAMUSCULAR | Status: AC
  Administered 2018-01-13: 0.5 mg via INTRAVENOUS
  Filled 2018-01-13: qty 1

## 2018-01-13 MED ORDER — ONDANSETRON HCL 4 MG/2ML IJ SOLN
4.0000 mg | Freq: Once | INTRAMUSCULAR | Status: AC
  Administered 2018-01-13: 4 mg via INTRAVENOUS
  Filled 2018-01-13: qty 2

## 2018-01-13 MED ORDER — HYDROMORPHONE HCL 1 MG/ML IJ SOLN: 1.0000 mg | Freq: Once | INTRAMUSCULAR | Status: DC

## 2018-01-13 NOTE — Discharge Instructions (Signed)
Take tylenol and ibuprofen in addition to the antibiotics. Return here in 2 days and tell the triage RN that you are here for a wound check. Fast Track opens at 12 so be here by 11 am.

## 2018-01-13 NOTE — ED Provider Notes (Signed)
[facility]-EMERGENCY DEPT Provider Note   CSN: [id number] Arrival date & time: 01/13/18  [id number]     History   Chief Complaint Chief Complaint  Patient presents with  . swollen left knee    HPI [name] [name] is a 32 y.o. female who presents to the ED with knee pain. The pain is located in the left knee. Patient reports swelling and drainage. Patient states she thinks she had an insect bite that got infected. Symptoms started a week ago and have gotten worse. Patient went to Urgent Care yesterday and was treated with Rocephin IM. Patient reports she did not get Rx filled. Note in patient record records Rx for Augmentin.   The history is provided by the patient. No language interpreter was used.  Wound Check  This is a new problem. The current episode started more than 1 week ago. The problem occurs constantly. The problem has been gradually worsening. Pertinent negatives include no chest pain, no abdominal pain, no headaches and no shortness of breath. The symptoms are aggravated by walking and standing. Nothing relieves the symptoms.    Past Medical History:  Diagnosis Date  . Abnormal Pap smear   . Anemia   . Anxiety   . Depression   . Kidney infection   . Substance abuse (HCC)   . Trichimoniasis   . UTI (lower urinary tract infection)   . Vaginal Pap smear, abnormal     Patient Active Problem List   Diagnosis Date Noted  . SVD (spontaneous vaginal delivery) 10/08/2014  . Late prenatal care affecting pregnancy in second trimester, antepartum 08/23/2014  . Substance abuse affecting pregnancy in first trimester, antepartum 08/23/2014  . History of preterm delivery, currently pregnant in second trimester 08/23/2014  . Incarceration 08/23/2014    Past Surgical History:  Procedure Laterality Date  . INDUCED ABORTION      OB History    Gravida Para Term Preterm AB Living   5 4   4 1 4    SAB TAB Ectopic Multiple Live Births     1   0 4        Home Medications    Prior to Admission medications   Medication Sig Start Date End Date Taking? Authorizing Provider  acetaminophen (TYLENOL) 325 MG tablet Take 650 mg by mouth every 6 (six) hours as needed.   Yes [provider]  ibuprofen (ADVIL,MOTRIN) 800 MG tablet Take 1 tablet (800 mg total) by mouth 3 (three) times daily. 02/28/15  Yes Szekalski, [name], PA-C  clindamycin (CLEOCIN) 300 MG capsule Take 1 capsule (300 mg total) by mouth 3 (three) times daily. 01/13/18   Janne NapoleonNeese, Hope M, NP    Family History Family History  Problem Relation Age of Onset  . Cancer Mother   . Hypertension Mother   . Diabetes Father   . Hypertension Father   . Hyperlipidemia Father   . Mental illness Maternal Grandmother     Social History Social History   Tobacco Use  . Smoking status: Current Every Day Smoker    Packs/day: 0.50    Types: Cigarettes  . Smokeless tobacco: Never Used  Substance Use Topics  . Alcohol use: Yes    Alcohol/week: 2.4 oz    Types: 4 Cans of beer per week    Comment: occasional   . Drug use: Yes    Types: Marijuana, "Crack" cocaine, Cocaine    Comment: former use of cocaine and marijuana      Allergies  Patient has no known allergies.   Review of Systems Review of Systems  Constitutional: Positive for chills. Fever: ?  HENT: Negative.   Eyes: Negative for visual disturbance.  Respiratory: Negative for cough and shortness of breath.   Cardiovascular: Negative for chest pain.  Gastrointestinal: Negative for abdominal pain, nausea and vomiting.  Genitourinary: Negative for dysuria, frequency and urgency.  Musculoskeletal: Negative for back pain, neck pain and neck stiffness.  Skin: Positive for wound.  Neurological: Negative for syncope and headaches.  Hematological: Positive for adenopathy.  Psychiatric/Behavioral: Negative for confusion.     Physical Exam Updated Vital Signs BP 117/65 (BP Location: Left Arm)   Pulse 73   Temp 99.8  F (37.7 C) (Oral)   Resp 20   Ht 5\' 8"  (1.727 m)   Wt 63.5 kg (140 lb)   LMP 01/12/2018   SpO2 100%   BMI 21.29 kg/m   Physical Exam  Constitutional: She is oriented to person, place, and time. She appears well-developed and well-nourished. No distress.  HENT:  Head: Normocephalic and atraumatic.  Eyes: Conjunctivae and EOM are normal.  Neck: Neck supple.  Cardiovascular: Normal rate and regular rhythm.  Pulmonary/Chest: Effort normal and breath sounds normal.  Musculoskeletal:       Left knee: She exhibits swelling and erythema. She exhibits normal range of motion. Tenderness found.       Legs: There is a 14 x 18 cm area of erythema to the anterior aspect of the left knee with a central area of raised black scab with purulent drainage. The area where the border was defined and marked yesterday has spread.   Lymphadenopathy: Inguinal adenopathy noted on the left side.  Neurological: She is alert and oriented to person, place, and time. No cranial nerve deficit.  Skin: Skin is warm and dry.  Psychiatric: She has a normal mood and affect.  Nursing note and vitals reviewed.        ED Treatments / Results  Labs (all labs ordered are listed, but only abnormal results are displayed) Labs Reviewed  CBC WITH DIFFERENTIAL/PLATELET - Abnormal; Notable for the following components:      Result Value   WBC 14.5 (*)    RBC 3.79 (*)    Hemoglobin 11.9 (*)    Neutro Abs 10.8 (*)    Monocytes Absolute 1.5 (*)    All other components within normal limits  BASIC METABOLIC PANEL - Abnormal; Notable for the following components:   Glucose, Bld 102 (*)    Calcium 8.5 (*)    All other components within normal limits   Radiology Dg Knee Complete 4 Views Left  Result Date: 01/13/2018 CLINICAL DATA:  Left anterior knee wound and infection. EXAM: LEFT KNEE - COMPLETE 4+ VIEW COMPARISON:  None. FINDINGS: Mild anterior soft tissue irregularity inferior to the patella. Normal appearing  bones. No effusion. No soft tissue gas. IMPRESSION: Anterior soft tissue irregularity without underlying bony abnormality or soft tissue gas. Electronically Signed   By: Beckie Salts M.D.   On: 01/13/2018 17:31   Dr. Silverio Lay in to examine the patient and ultrasound the knee.  Procedures .Marland KitchenIncision and Drainage Date/Time: 01/13/2018 8:32 PM Performed by: Janne Napoleon, NP Authorized by: Janne Napoleon, NP   Consent:    Consent obtained:  Verbal   Consent given by:  Patient   Risks discussed:  Incomplete drainage and pain Location:    Type:  Abscess   Location:  Lower extremity   Lower extremity  location:  Knee   Knee location:  L knee Pre-procedure details:    Skin preparation:  Betadine Anesthesia (see MAR for exact dosages):    Anesthesia method:  Local infiltration   Local anesthetic:  Lidocaine 2% w/o epi Procedure type:    Complexity:  Complex Procedure details:    Needle aspiration: no     Incision types:  Single straight   Incision depth:  Dermal   Scalpel blade:  11   Wound management:  Probed and deloculated, irrigated with saline and debrided   Drainage:  Purulent   Drainage amount:  Moderate   Wound treatment:  Wound left open   Packing materials:  None Post-procedure details:    Patient tolerance of procedure:  Tolerated well, no immediate complications   (including critical care time)  Medications Ordered in ED Medications  Tdap (BOOSTRIX) injection 0.5 mL (not administered)  clindamycin (CLEOCIN) IVPB 600 mg (0 mg Intravenous Stopped 01/13/18 2006)  ondansetron (ZOFRAN) injection 4 mg (4 mg Intravenous Given 01/13/18 2013)  HYDROmorphone (DILAUDID) injection 0.5 mg (0.5 mg Intravenous Given 01/13/18 2014)     Initial Impression / Assessment and Plan / ED Course  I have reviewed the triage vital signs and the nursing notes. 32 y.o. female with pain, swelling and redness to the left knee stable for d/c after IV Clindamycin, pain management and I&D of abscess. Patient  to return in 2 days for wound check or sooner for worsening symptoms. Discussed with the patient return precautions.   Final Clinical Impressions(s) / ED Diagnoses   Final diagnoses:  Cellulitis of left knee  Abscess of knee, left    ED Discharge Orders        Ordered    clindamycin (CLEOCIN) 300 MG capsule  3 times daily     01/13/18 2027       Kerrie [place], [place] 01/13/18 2114    Charlynne Pander, MD 01/13/18 [phone]

## 2018-01-13 NOTE — ED Notes (Signed)
MD and PA at bedside.  

## 2018-01-13 NOTE — ED Notes (Signed)
Patient transported to X-ray 

## 2018-01-13 NOTE — ED Triage Notes (Signed)
Patient reports that left knee swelling and drainage for week from poss insect bite. Reports family member has been cleaning it. Reports that she was seen at Urgent Care yesterday and got shot and not sent home with any medications. Reports pain when moving
